# Patient Record
Sex: Male | Born: 1950 | Race: White | Hispanic: No | Marital: Single | Smoking: Never smoker
Health system: Southern US, Community
[De-identification: ages and names within clinical notes are randomized; demographics above are authoritative.]

## PROBLEM LIST (undated history)

## (undated) DIAGNOSIS — Z973 Presence of spectacles and contact lenses: Secondary | ICD-10-CM

## (undated) DIAGNOSIS — Z87442 Personal history of urinary calculi: Secondary | ICD-10-CM

## (undated) DIAGNOSIS — Z87438 Personal history of other diseases of male genital organs: Secondary | ICD-10-CM

## (undated) DIAGNOSIS — K649 Unspecified hemorrhoids: Secondary | ICD-10-CM

## (undated) DIAGNOSIS — M4622 Osteomyelitis of vertebra, cervical region: Secondary | ICD-10-CM

## (undated) DIAGNOSIS — E46 Unspecified protein-calorie malnutrition: Secondary | ICD-10-CM

## (undated) DIAGNOSIS — M722 Plantar fascial fibromatosis: Secondary | ICD-10-CM

## (undated) DIAGNOSIS — D649 Anemia, unspecified: Secondary | ICD-10-CM

## (undated) HISTORY — PX: COLONOSCOPY: SHX174

## (undated) HISTORY — PX: TONSILLECTOMY: SUR1361

## (undated) HISTORY — PX: KIDNEY STONE SURGERY: SHX686

## (undated) HISTORY — PX: HYDROCELE EXCISION: SHX482

---

## 2009-02-09 ENCOUNTER — Ambulatory Visit (HOSPITAL_BASED_OUTPATIENT_CLINIC_OR_DEPARTMENT_OTHER): Admission: RE | Admit: 2009-02-09 | Discharge: 2009-02-09 | Payer: Self-pay | Admitting: Urology

## 2010-09-17 LAB — POCT HEMOGLOBIN-HEMACUE: Hemoglobin: 14.8 g/dL (ref 13.0–17.0)

## 2010-10-25 NOTE — Op Note (Signed)
NAME:  Jacob Logan, Jacob Logan NO.:  [id number]   MEDICAL RECORD NO.:  [id number]          PATIENT TYPE:  AMB   LOCATION:  [facility]                         FACILITY:  [facility]   PHYSICIAN:  Excell Seltzer. Annabell Howells, M.D.    DATE OF BIRTH:  28-Jun-1950   DATE OF PROCEDURE:  02/09/2009  DATE OF DISCHARGE:                               OPERATIVE REPORT   PROCEDURES:  Cystoscopy, left retrograde pyelogram with interpretation,  left ureteroscopic stone extraction and insertion of left double-J  stent.   PREOPERATIVE DIAGNOSIS:  Left distal stones.   POSTOPERATIVE DIAGNOSIS:  Left distal stones.   SURGEON:  Dr. Bjorn Pippin.   ANESTHESIA:  General.   SPECIMEN:  Stones.   DRAIN:  6-French 24 cm double-J stent.   COMPLICATIONS:  None.   INDICATIONS:  Jacob Logan is a 60 year old white male with a 53-month  history of gross hematuria.  He has had some left-sided pain.  He was  found on CT to have two 5 mm distal left ureteral stones.  It was felt  that ureteroscopy was indicated.   FINDINGS AND PROCEDURE:  The patient was taken to the operating room  after receiving Cipro.  A general anesthetic was induced.  He was placed  in lithotomy position.  His perineum and genitalia were prepped with  Betadine solution.  He was draped in the usual sterile fashion.  Cystoscopy was performed using a 22-French scope and 12 and 70 degree  lenses.  Examination revealed a normal urethra.  The external sphincter  was intact.  The prostatic urethra was short with bilobar hyperplasia  with minimal obstruction.  Examination of the bladder revealed a smooth  bladder wall without tumor, stones or inflammation.  The ureteral  orifices were unremarkable.   The left ureteral orifice was cannulated with a 5-French open-end  catheter.  Contrast was instilled.  This revealed some narrowing of the  distal ureter with two stones just above the intramural ureter that were  mobile within the distal ureter.  There was  minimal proximal  hydronephrosis.  No other filling defects were identified.   A guidewire was then passed to the kidney through the open-end catheter  and the open-end catheter and cystoscope were removed.  A 4 cm 15-French  balloon dilation catheter was passed across the intramural ureter and  dilated to 18 atmospheres with resolution of the waste.  The balloon was  then deflated and removed.   A 6-French short ureteroscope was passed alongside the wire.  The stones  were visualized, grasped with a nitinol basket and removed sequentially  without difficulty.   The cystoscope was then reinserted over the wire and a 6-French 24 cm  double-J stent with string was then passed over the wire under  fluoroscopic guidance to the kidney.  The wire was removed leaving a  coil in the kidney and a coil in the bladder.  The bladder was drained.  B and O suppository was placed.  He was given 30 mg of Toradol.  He was  taken down from lithotomy position.  His anesthetic was  reversed.  He  was moved to the recovery room in stable condition.  There were no  complications.  He will be discharged home with prescriptions for  Percocet and Pyridium with instructions to follow-up with me on  February 17, 2009, at 11:30.  He will bring his stones when he returns  and will be encouraged to remove his stent at home on Friday morning by  pulling the attached string.      Excell Seltzer. Annabell Howells, M.D.  Electronically Signed     JJW/MEDQ  D:  02/09/2009  T:  02/09/2009  Job:  [id number]   cc:   Peyton Najjar, MD

## 2012-09-06 ENCOUNTER — Ambulatory Visit: Payer: Self-pay

## 2012-09-06 ENCOUNTER — Ambulatory Visit (INDEPENDENT_AMBULATORY_CARE_PROVIDER_SITE_OTHER): Payer: Self-pay | Admitting: Family Medicine

## 2012-09-06 VITALS — BP 113/70 | HR 57 | Temp 98.1°F | Resp 16 | Ht 68.0 in | Wt 138.0 lb

## 2012-09-06 DIAGNOSIS — M25529 Pain in unspecified elbow: Secondary | ICD-10-CM

## 2012-09-06 DIAGNOSIS — G8929 Other chronic pain: Secondary | ICD-10-CM

## 2012-09-06 DIAGNOSIS — M25521 Pain in right elbow: Secondary | ICD-10-CM

## 2012-09-06 MED ORDER — PREDNISONE 20 MG PO TABS: ORAL_TABLET | ORAL | Status: DC

## 2012-09-06 NOTE — Progress Notes (Signed)
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  No meds tried. The swelling is worsening.   No ecchymosis. Tried a band over the area to protect it against bumping it, but he didn't tolerate the pressure of the band.  Objective:  NAD Tender subq swelling which is firm over lateral epicondyle.  [facility] reading (PRIMARY) by  Dr.Cailean Heacock:  Right  Elbow.  No bony abn.  Some STS  Assessment:  Lateral epicondylitis  Plan  Elbow pain, chronic, right - Plan: DG Elbow Complete Right, predniSONE (DELTASONE) 20 MG tablet

## 2012-09-06 NOTE — Patient Instructions (Addendum)
Tennis Elbow  Your caregiver has diagnosed you with a condition often referred to as "tennis elbow." This results from small tears or soreness (inflammation) at the start (origin) of the extensor muscles of the forearm. Although the condition is often called tennis or golfer's elbow, it is caused by any repetitive action performed by your elbow.  HOME CARE INSTRUCTIONS   If the condition has been short lived, rest may be the only treatment required. Using your opposite hand or arm to perform the task may help. Even changing your grip may help rest the extremity. These may even prevent the condition from recurring.   Longer standing problems, however, will often be relieved faster by:   Using anti-inflammatory agents.   Applying ice packs for 30 minutes at the end of the working day, at bed time, or when activities are finished.   Your caregiver may also have you wear a splint or sling. This will allow the inflamed tendon to heal.  At times, steroid injections aided with a local anesthetic will be required along with splinting for 1 to 2 weeks. Two to three steroid injections will often solve the problem. In some long standing cases, the inflamed tendon does not respond to conservative (non-surgical) therapy. Then surgery may be required to repair it.  MAKE SURE YOU:    Understand these instructions.   Will watch your condition.   Will get help right away if you are not doing well or get worse.  Document Released: 05/29/2005 Document Revised: 08/21/2011 Document Reviewed: 01/15/2008  [employer] Patient Information 2013 [employer].

## 2013-02-13 ENCOUNTER — Encounter (HOSPITAL_COMMUNITY): Payer: Self-pay

## 2013-02-13 ENCOUNTER — Emergency Department (HOSPITAL_COMMUNITY)
Admission: EM | Admit: 2013-02-13 | Discharge: 2013-02-13 | Disposition: A | Discharge status: Home / Self Care | Payer: Worker's Compensation | Attending: Emergency Medicine | Admitting: Emergency Medicine

## 2013-02-13 ENCOUNTER — Emergency Department (HOSPITAL_COMMUNITY): Payer: Worker's Compensation

## 2013-02-13 DIAGNOSIS — W208XXA Other cause of strike by thrown, projected or falling object, initial encounter: Secondary | ICD-10-CM | POA: Insufficient documentation

## 2013-02-13 DIAGNOSIS — Y9289 Other specified places as the place of occurrence of the external cause: Secondary | ICD-10-CM | POA: Insufficient documentation

## 2013-02-13 DIAGNOSIS — S8990XA Unspecified injury of unspecified lower leg, initial encounter: Secondary | ICD-10-CM | POA: Insufficient documentation

## 2013-02-13 DIAGNOSIS — M79605 Pain in left leg: Secondary | ICD-10-CM

## 2013-02-13 DIAGNOSIS — Y990 Civilian activity done for income or pay: Secondary | ICD-10-CM | POA: Insufficient documentation

## 2013-02-13 DIAGNOSIS — Z87442 Personal history of urinary calculi: Secondary | ICD-10-CM | POA: Insufficient documentation

## 2013-02-13 DIAGNOSIS — Y939 Activity, unspecified: Secondary | ICD-10-CM | POA: Insufficient documentation

## 2013-02-13 DIAGNOSIS — IMO0002 Reserved for concepts with insufficient information to code with codable children: Secondary | ICD-10-CM | POA: Insufficient documentation

## 2013-02-13 MED ORDER — CYCLOBENZAPRINE HCL 10 MG PO TABS: 10.0000 mg | ORAL_TABLET | Freq: Two times a day (BID) | ORAL | Status: DC | PRN

## 2013-02-13 MED ORDER — ACETAMINOPHEN 325 MG PO TABS
650.0000 mg | ORAL_TABLET | Freq: Once | ORAL | Status: AC
  Administered 2013-02-13: 650 mg via ORAL
  Filled 2013-02-13: qty 2

## 2013-02-13 NOTE — ED Provider Notes (Signed)
CSN: [id number]     Arrival date & time 02/13/13  0739 History   First MD Initiated Contact with Patient 02/13/13 0747     No chief complaint on file.  (Consider location/radiation/quality/duration/timing/severity/associated sxs/prior Treatment) HPI  Jacob Logan is a 62 y.o. male complaining of left lower extremity and right foot pain after patient was injured at work at 11 PM last night. Patient pieces of wood fall from a position where they were propped against the wall onto him and pinned him. Patient denies any head trauma, LOC, headache, chest pain, shortness of breath, abdominal pain. Patient rates his pain as moderate, 7/10, exacerbated by weightbearing and palpation.  Past Medical History  Diagnosis Date  . Kidney stone    Past Surgical History  Procedure Laterality Date  . Tonsillectomy    . Kidney stone surgery     Family History  Problem Relation Age of Onset  . Hypertension Mother   . Uterine cancer Mother   . Obesity Mother   . Obesity Father   . Hypertension Father   . Diabetes Father   . Stroke Brother   . Obesity Brother   . Alzheimer's disease Brother   . Cataracts Maternal Grandmother    History  Substance Use Topics  . Smoking status: Never Smoker   . Smokeless tobacco: Not on file  . Alcohol Use: 0.5 oz/week    1 drink(s) per week    Review of Systems 10 systems reviewed and found to be negative, except as noted in the HPI  Allergies  Review of patient's allergies indicates no known allergies.  Home Medications   Current Outpatient Rx  Name  Route  Sig  Dispense  Refill  . predniSONE (DELTASONE) 20 MG tablet      Two daily with food   10 tablet   1    BP 113/55  Pulse 61  Temp(Src) 98.1 F (36.7 C) (Oral)  Resp 18  SpO2 99% Physical Exam  Nursing note and vitals reviewed. Constitutional: He is oriented to person, place, and time. He appears well-developed and well-nourished. No distress.  HENT:  Head: Normocephalic.  Eyes:  Conjunctivae and EOM are normal.  Cardiovascular: Normal rate.   Pulmonary/Chest: Effort normal. No stridor.  Musculoskeletal: Normal range of motion.  No deformities, dorsalis pedis is 2+ bilaterally. Patient is very mildly tender to palpation in the left lateral calf, there is no ecchymoses.  Bilateral knees show No deformity, erythema or abrasions. FROM. No effusion or crepitance. Anterior and posterior drawer show no abnormal laxity. Stable to valgus and varus stress. Joint lines are non-tender. Neurovascularly intact.   Patient has mild erythema to left elbow, full range of motion, no tenderness to palpation. Distally neurovascularly intact.  Neurological: He is alert and oriented to person, place, and time.  Psychiatric: He has a normal mood and affect.    ED Course  Procedures (including critical care time) Labs Review Labs Reviewed - No data to display Imaging Review Dg Ankle Complete Left  02/13/2013   CLINICAL DATA:  Left ankle pain.  EXAM: LEFT ANKLE COMPLETE - 3+ VIEW  COMPARISON:  None.  FINDINGS: There is no evidence of fracture, dislocation, or joint effusion. There is no evidence of arthropathy or other focal bone abnormality. Soft tissues are unremarkable.  IMPRESSION: Negative.   Electronically Signed   By: Myles Rosenthal   On: 02/13/2013 09:03   Dg Knee Complete 4 Views Left  02/13/2013   CLINICAL DATA:  Left knee pain.  EXAM: LEFT KNEE - COMPLETE 4+ VIEW  COMPARISON:  None.  FINDINGS: There is no evidence of fracture, dislocation, or joint effusion. There is no evidence of arthropathy or other focal bone abnormality. Soft tissues are unremarkable.  IMPRESSION: Negative.   Electronically Signed   By: Myles Rosenthal   On: 02/13/2013 09:07   Dg Foot Complete Right  02/13/2013   CLINICAL DATA:  Right foot pain.  EXAM: RIGHT FOOT COMPLETE - 3+ VIEW  COMPARISON:  None.  FINDINGS: A nondisplaced distal 5th metatarsal fracture is seen which shows some sclerosis consistent with partial  healing. This is consistent with a subacute fracture. No other fractures are identified. No evidence of dislocation.  IMPRESSION: Healing subacute distal 5th metatarsal fracture.   Electronically Signed   By: Myles Rosenthal   On: 02/13/2013 09:06    MDM   1. Musculoskeletal pain of left lower extremity    Filed Vitals:   02/13/13 0750  BP: 113/55  Pulse: 61  Temp: 98.1 F (36.7 C)  TempSrc: Oral  Resp: 18  SpO2: 99%     Jacob Logan is a 62 y.o. male with left calf and right foot pain after patient was injured last night at work. Physical exam shows no abnormalities other than erythema to the right elbow and tenderness to the left lateral calf.  Medications  acetaminophen (TYLENOL) tablet 650 mg (650 mg Oral Given 02/13/13 0820)   Pt is hemodynamically stable, appropriate for, and amenable to discharge at this time. Pt verbalized understanding and agrees with care plan. All questions answered. Outpatient follow-up and specific return precautions discussed.    New Prescriptions   CYCLOBENZAPRINE (FLEXERIL) 10 MG TABLET    Take 1 tablet (10 mg total) by mouth 2 (two) times daily as needed for muscle spasms.    Note: Portions of this report may have been transcribed using voice recognition software. Every effort was made to ensure accuracy; however, inadvertent computerized transcription errors may be present      Wynetta Emery, PA-C 02/13/13 [phone]

## 2013-02-13 NOTE — ED Notes (Signed)
Pt. Was injured at work last night. Had 2 boards fall on him and pin him. Both legs were bent up.  Having pain in lt. Lower leg.  bruising to his lt. Elbow.

## 2013-02-13 NOTE — ED Provider Notes (Signed)
Medical screening examination/treatment/procedure(s) were performed by non-physician practitioner and as supervising physician I was immediately available for consultation/collaboration.   Glynn Octave, MD 02/13/13 [phone]

## 2013-02-14 ENCOUNTER — Ambulatory Visit (HOSPITAL_COMMUNITY)
Admission: RE | Admit: 2013-02-14 | Discharge: 2013-02-14 | Disposition: A | Discharge status: Home / Self Care | Payer: Worker's Compensation | Source: Ambulatory Visit | Attending: Surgery | Admitting: Surgery

## 2013-02-14 ENCOUNTER — Other Ambulatory Visit (HOSPITAL_COMMUNITY): Payer: Self-pay | Admitting: Surgery

## 2013-02-14 DIAGNOSIS — M25562 Pain in left knee: Secondary | ICD-10-CM

## 2013-02-14 DIAGNOSIS — M25569 Pain in unspecified knee: Secondary | ICD-10-CM | POA: Insufficient documentation

## 2013-02-14 DIAGNOSIS — M7989 Other specified soft tissue disorders: Secondary | ICD-10-CM

## 2013-02-14 NOTE — Progress Notes (Signed)
Left lower extremity venous duplex completed.  Left:  No evidence of DVT, superficial thrombosis, or Baker's cyst.  Right:  Negative for DVT in the common femoral vein.  

## 2013-05-18 ENCOUNTER — Ambulatory Visit: Payer: Commercial Managed Care - PPO

## 2013-05-18 ENCOUNTER — Ambulatory Visit (INDEPENDENT_AMBULATORY_CARE_PROVIDER_SITE_OTHER): Payer: Commercial Managed Care - PPO | Admitting: Emergency Medicine

## 2013-05-18 VITALS — BP 118/70 | HR 85 | Temp 99.3°F | Resp 16 | Ht 68.5 in | Wt 141.8 lb

## 2013-05-18 DIAGNOSIS — R059 Cough, unspecified: Secondary | ICD-10-CM

## 2013-05-18 DIAGNOSIS — M549 Dorsalgia, unspecified: Secondary | ICD-10-CM

## 2013-05-18 DIAGNOSIS — N200 Calculus of kidney: Secondary | ICD-10-CM

## 2013-05-18 DIAGNOSIS — N10 Acute tubulo-interstitial nephritis: Secondary | ICD-10-CM

## 2013-05-18 DIAGNOSIS — R05 Cough: Secondary | ICD-10-CM

## 2013-05-18 DIAGNOSIS — R300 Dysuria: Secondary | ICD-10-CM

## 2013-05-18 LAB — POCT URINALYSIS DIPSTICK
Glucose, UA: NEGATIVE
Nitrite, UA: POSITIVE

## 2013-05-18 LAB — POCT UA - MICROSCOPIC ONLY
Mucus, UA: POSITIVE
Yeast, UA: NEGATIVE

## 2013-05-18 LAB — POCT CBC
HCT, POC: 42.9 % — AB (ref 43.5–53.7)
Hemoglobin: 13.2 g/dL — AB (ref 14.1–18.1)
Lymph, poc: 1 (ref 0.6–3.4)
MCH, POC: 28.8 pg (ref 27–31.2)
MCHC: 30.8 g/dL — AB (ref 31.8–35.4)
MCV: 93.5 fL (ref 80–97)
POC Granulocyte: 10.2 — AB (ref 2–6.9)
POC LYMPH PERCENT: 8.6 %L — AB (ref 10–50)
RDW, POC: 13.1 %
WBC: 11.8 10*3/uL — AB (ref 4.6–10.2)

## 2013-05-18 LAB — IFOBT (OCCULT BLOOD): IFOBT: NEGATIVE

## 2013-05-18 MED ORDER — CEFTRIAXONE SODIUM 1 G IJ SOLR
1.0000 g | INTRAMUSCULAR | Status: DC
  Administered 2013-05-18: 1 g via INTRAMUSCULAR

## 2013-05-18 MED ORDER — LEVOFLOXACIN 500 MG PO TABS: 500.0000 mg | ORAL_TABLET | Freq: Every day | ORAL | Status: AC

## 2013-05-18 MED ORDER — OMEPRAZOLE 20 MG PO CPDR: 20.0000 mg | DELAYED_RELEASE_CAPSULE | Freq: Every day | ORAL | Status: DC

## 2013-05-18 MED ORDER — BENZONATATE 100 MG PO CAPS: 100.0000 mg | ORAL_CAPSULE | Freq: Three times a day (TID) | ORAL | Status: DC | PRN

## 2013-05-18 NOTE — Progress Notes (Addendum)
Subjective:    Patient ID: Jacob Logan, male    DOB: Oct 23, 1950, 62 y.o.   MRN: [id number] This chart was scribed for Lesle Chris, MD by Danella Maiers, ED Scribe. This patient was seen in room 10 and the patient's care was started at 4:16 PM.  Chief Complaint  Patient presents with  . Cough    x6 weeks  . Back Pain    left lower x1 week  . Pelvic Pain    left, started today  . Dysuria    "hematuria/burning" started today    HPI HPI Comments: Jacob Logan is a 62 y.o. male with a h/o kidney stones who presents to the [facility] complaining of lower back pain onset five days ago and hematuria with "little flakes of something". He has had surgical removal of stones in the past. He states he has been eating well until this week due to nausea. He denies vomiting. He is a non-smoker.  He also reports persistent cough with green phlegm for the past 6 weeks.  There are no active problems to display for this patient.  Past Medical History  Diagnosis Date  . Kidney stone    Past Surgical History  Procedure Laterality Date  . Tonsillectomy    . Kidney stone surgery     No Known Allergies Prior to Admission medications   Medication Sig Start Date End Date Taking? Authorizing Provider  bisacodyl (DULCOLAX) 5 MG EC tablet Take 5 mg by mouth daily as needed for moderate constipation.   Yes Historical Provider, MD  cyclobenzaprine (FLEXERIL) 10 MG tablet Take 1 tablet (10 mg total) by mouth 2 (two) times daily as needed for muscle spasms. 02/13/13   Joni Reining Pisciotta, PA-C   History  Substance Use Topics  . Smoking status: Never Smoker   . Smokeless tobacco: Not on file  . Alcohol Use: 0.5 oz/week    1 drink(s) per week     Review of Systems  Respiratory: Positive for cough.   Gastrointestinal: Positive for nausea. Negative for vomiting.  Genitourinary: Positive for hematuria.  Musculoskeletal: Positive for back pain.       Objective:   Physical  Exam CONSTITUTIONAL: Well developed/well nourished HEAD: Normocephalic/atraumatic EYES: EOMI/PERRL ENMT: Mucous membranes moist NECK: supple no meningeal signs SPINE: Patient has a significant kyphoscoliotic curve. CV: S1/S2 noted, no murmurs/rubs/gallops noted LUNGS: Lungs are clear to auscultation bilaterally, no apparent distress ABDOMEN: soft, nontender, no rebound or guarding there is mild tenderness over the prostate gland but not in the left lower quadrant the GU there is left  CVA tenderness NEURO: Pt is awake/alert, moves all extremitiesx4 EXTREMITIES: pulses normal, full ROM SKIN: warm, color normal PSYCH: no abnormalities of mood noted Prostate is normal size without nodularity or tenderness  Filed Vitals:   05/18/13 1539  BP: 118/70  Pulse: 85  Temp: 99.3 F (37.4 C)  TempSrc: Oral  Resp: 16  Height: 5' 8.5" (1.74 m)  Weight: 141 lb 12.8 oz (64.32 kg)  SpO2: 100%  [facility] reading (PRIMARY) by  Dr. Cleta Alberts patient has a severe kyphoscoliosis. Lung fields appear clear. There are 2 calcific densities in the lower portion of the right kidney. These would be consistent with stones. There are also multiple calcific densities present in the prostate gland. Results for orders placed in visit on 05/18/13  POCT UA - MICROSCOPIC ONLY      Result Value Range   WBC, Ur, HPF, POC TNTC     RBC,  urine, microscopic TNTC     Bacteria, U Microscopic 3+     Mucus, UA Pos     Epithelial cells, urine per micros neg     Crystals, Ur, HPF, POC neg     Casts, Ur, LPF, POC neg     Yeast, UA neg    POCT URINALYSIS DIPSTICK      Result Value Range   Color, UA Brown     Clarity, UA Turbid     Glucose, UA Neg     Bilirubin, UA small     Ketones, UA 40     Spec Grav, UA >=1.030     Blood, UA Large     pH, UA 5.5     Protein, UA >300     Urobilinogen, UA 1.0     Nitrite, UA pos     Leukocytes, UA large (3+)    POCT CBC      Result Value Range   WBC 11.8 (*) 4.6 - 10.2 K/uL   Lymph, poc  1.0  0.6 - 3.4   POC LYMPH PERCENT 8.6 (*) 10 - 50 %L   MID (cbc) 0.6  0 - 0.9   POC MID % 4.9  0 - 12 %M   POC Granulocyte 10.2 (*) 2 - 6.9   Granulocyte percent 86.5 (*) 37 - 80 %G   RBC 4.59 (*) 4.69 - 6.13 M/uL   Hemoglobin 13.2 (*) 14.1 - 18.1 g/dL   HCT, POC 08.6 (*) 57.8 - 53.7 %   MCV 93.5  80 - 97 fL   MCH, POC 28.8  27 - 31.2 pg   MCHC 30.8 (*) 31.8 - 35.4 g/dL   RDW, POC 46.9     Platelet Count, POC 176  142 - 424 K/uL   MPV 9.6  0 - 99.8 fL  IFOBT (OCCULT BLOOD)      Result Value Range   IFOBT Negative          Assessment & Plan:  Patient appears to have infection either in the prostate or round the right renal calculi. Urgent referral will be made to Dr. Merry Lofty. You will be placed on Levaquin 500 mg daily urine culture was done referral made back to Dr. Merry Lofty. He will be given 1 g of Rocephin here.Marland Kitchen He is tender in the left flank area but his kidney stone is visible on the right side.  I personally performed the services described in this documentation, which was scribed in my presence. The recorded information has been reviewed and is accurate.

## 2013-05-18 NOTE — Patient Instructions (Signed)
Cough, Adult  A cough is a reflex that helps clear your throat and airways. It can help heal the body or may be a reaction to an irritated airway. A cough may only last 2 or 3 weeks (acute) or may last more than 8 weeks (chronic).  CAUSES Acute cough:  Viral or bacterial infections. Chronic cough:  Infections.  Allergies.  Asthma.  Post-nasal drip.  Smoking.  Heartburn or acid reflux.  Some medicines.  Chronic lung problems (COPD).  Cancer. SYMPTOMS   Cough.  Fever.  Chest pain.  Increased breathing rate.  High-pitched whistling sound when breathing (wheezing).  Colored mucus that you cough up (sputum). TREATMENT   A bacterial cough may be treated with antibiotic medicine.  A viral cough must run its course and will not respond to antibiotics.  Your caregiver may recommend other treatments if you have a chronic cough. HOME CARE INSTRUCTIONS   Only take over-the-counter or prescription medicines for pain, discomfort, or fever as directed by your caregiver. Use cough suppressants only as directed by your caregiver.  Use a cold steam vaporizer or humidifier in your bedroom or home to help loosen secretions.  Sleep in a semi-upright position if your cough is worse at night.  Rest as needed.  Stop smoking if you smoke. SEEK IMMEDIATE MEDICAL CARE IF:   You have pus in your sputum.  Your cough starts to worsen.  You cannot control your cough with suppressants and are losing sleep.  You begin coughing up blood.  You have difficulty breathing.  You develop pain which is getting worse or is uncontrolled with medicine.  You have a fever. MAKE SURE YOU:   Understand these instructions.  Will watch your condition.  Will get help right away if you are not doing well or get worse. Document Released: 11/25/2010 Document Revised: 08/21/2011 Document Reviewed: 11/25/2010 [facility] Patient Information 2014 [place], [place]. Kidney Stones Kidney stones  (urolithiasis) are deposits that form inside your kidneys. The intense pain is caused by the stone moving through the urinary tract. When the stone moves, the ureter goes into spasm around the stone. The stone is usually passed in the urine.  CAUSES   A disorder that makes certain neck glands produce too much parathyroid hormone (primary hyperparathyroidism).  A buildup of uric acid crystals, similar to gout in your joints.  Narrowing (stricture) of the ureter.  A kidney obstruction present at birth (congenital obstruction).  Previous surgery on the kidney or ureters.  Numerous kidney infections. SYMPTOMS   Feeling sick to your stomach (nauseous).  Throwing up (vomiting).  Blood in the urine (hematuria).  Pain that usually spreads (radiates) to the groin.  Frequency or urgency of urination. DIAGNOSIS   Taking a history and physical exam.  Blood or urine tests.  CT scan.  Occasionally, an examination of the inside of the urinary bladder (cystoscopy) is performed. TREATMENT   Observation.  Increasing your fluid intake.  Extracorporeal shock wave lithotripsy This is a noninvasive procedure that uses shock waves to break up kidney stones.  Surgery may be needed if you have severe pain or persistent obstruction. There are various surgical procedures. Most of the procedures are performed with the use of small instruments. Only small incisions are needed to accommodate these instruments, so recovery time is minimized. The size, location, and chemical composition are all important variables that will determine the proper choice of action for you. Talk to your health care provider to better understand your situation so that you  will minimize the risk of injury to yourself and your kidney.  HOME CARE INSTRUCTIONS   Drink enough water and fluids to keep your urine clear or pale yellow. This will help you to pass the stone or stone fragments.  Strain all urine through the provided  strainer. Keep all particulate matter and stones for your health care provider to see. The stone causing the pain may be as small as a grain of salt. It is very important to use the strainer each and every time you pass your urine. The collection of your stone will allow your health care provider to analyze it and verify that a stone has actually passed. The stone analysis will often identify what you can do to reduce the incidence of recurrences.  Only take over-the-counter or prescription medicines for pain, discomfort, or fever as directed by your health care provider.  Make a follow-up appointment with your health care provider as directed.  Get follow-up X-rays if required. The absence of pain does not always mean that the stone has passed. It may have only stopped moving. If the urine remains completely obstructed, it can cause loss of kidney function or even complete destruction of the kidney. It is your responsibility to make sure X-rays and follow-ups are completed. Ultrasounds of the kidney can show blockages and the status of the kidney. Ultrasounds are not associated with any radiation and can be performed easily in a matter of minutes. SEEK MEDICAL CARE IF:  You experience pain that is progressive and unresponsive to any pain medicine you have been prescribed. SEEK IMMEDIATE MEDICAL CARE IF:   Pain cannot be controlled with the prescribed medicine.  You have a fever or shaking chills.  The severity or intensity of pain increases over 18 hours and is not relieved by pain medicine.  You develop a new onset of abdominal pain.  You feel faint or pass out.  You are unable to urinate. MAKE SURE YOU:   Understand these instructions.  Will watch your condition.  Will get help right away if you are not doing well or get worse. Document Released: 05/29/2005 Document Revised: 01/29/2013 Document Reviewed: 10/30/2012 [employer] Patient Information 2014 [place], [place]. Pyelonephritis,  Adult Pyelonephritis is a kidney infection. In general, there are 2 main types of pyelonephritis:  Infections that come on quickly without any warning (acute pyelonephritis).  Infections that persist for a long period of time (chronic pyelonephritis). CAUSES  Two main causes of pyelonephritis are:  Bacteria traveling from the bladder to the kidney. This is a problem especially in pregnant women. The urine in the bladder can become filled with bacteria from multiple causes, including:  Inflammation of the prostate gland (prostatitis).  Sexual intercourse in females.  Bladder infection (cystitis).  Bacteria traveling from the bloodstream to the tissue part of the kidney. Problems that may increase your risk of getting a kidney infection include:  Diabetes.  Kidney stones or bladder stones.  Cancer.  Catheters placed in the bladder.  Other abnormalities of the kidney or ureter. SYMPTOMS   Abdominal pain.  Pain in the side or flank area.  Fever.  Chills.  Upset stomach.  Blood in the urine (dark urine).  Frequent urination.  Strong or persistent urge to urinate.  Burning or stinging when urinating. DIAGNOSIS  Your caregiver may diagnose your kidney infection based on your symptoms. A urine sample may also be taken. TREATMENT  In general, treatment depends on how severe the infection is.   If the infection  is mild and caught early, your caregiver may treat you with oral antibiotics and send you home.  If the infection is more severe, the bacteria may have gotten into the bloodstream. This will require intravenous (IV) antibiotics and a hospital stay. Symptoms may include:  High fever.  Severe flank pain.  Shaking chills.  Even after a hospital stay, your caregiver may require you to be on oral antibiotics for a period of time.  Other treatments may be required depending upon the cause of the infection. HOME CARE INSTRUCTIONS   Take your antibiotics as  directed. Finish them even if you start to feel better.  Make an appointment to have your urine checked to make sure the infection is gone.  Drink enough fluids to keep your urine clear or pale yellow.  Take medicines for the bladder if you have urgency and frequency of urination as directed by your caregiver. SEEK IMMEDIATE MEDICAL CARE IF:   You have a fever or persistent symptoms for more than 2-3 days.  You have a fever and your symptoms suddenly get worse.  You are unable to take your antibiotics or fluids.  You develop shaking chills.  You experience extreme weakness or fainting.  There is no improvement after 2 days of treatment. MAKE SURE YOU:  Understand these instructions.  Will watch your condition.  Will get help right away if you are not doing well or get worse. Document Released: 05/29/2005 Document Revised: 11/28/2011 Document Reviewed: 11/02/2010 [facility] Patient Information 2014 [place], [place].

## 2013-05-19 LAB — COMPREHENSIVE METABOLIC PANEL
ALT: 13 U/L (ref 0–53)
AST: 20 U/L (ref 0–37)
Albumin: 4 g/dL (ref 3.5–5.2)
Alkaline Phosphatase: 79 U/L (ref 39–117)
Calcium: 9.2 mg/dL (ref 8.4–10.5)
Chloride: 98 mEq/L (ref 96–112)
Potassium: 4 mEq/L (ref 3.5–5.3)
Sodium: 134 mEq/L — ABNORMAL LOW (ref 135–145)

## 2013-05-20 LAB — URINE CULTURE

## 2014-03-07 ENCOUNTER — Ambulatory Visit (INDEPENDENT_AMBULATORY_CARE_PROVIDER_SITE_OTHER): Payer: Commercial Managed Care - PPO | Admitting: Internal Medicine

## 2014-03-07 VITALS — BP 108/70 | HR 51 | Resp 21 | Ht 67.5 in | Wt 135.2 lb

## 2014-03-07 DIAGNOSIS — N200 Calculus of kidney: Secondary | ICD-10-CM

## 2014-03-07 DIAGNOSIS — R350 Frequency of micturition: Secondary | ICD-10-CM

## 2014-03-07 DIAGNOSIS — R82998 Other abnormal findings in urine: Secondary | ICD-10-CM

## 2014-03-07 DIAGNOSIS — R8281 Pyuria: Secondary | ICD-10-CM

## 2014-03-07 DIAGNOSIS — R319 Hematuria, unspecified: Secondary | ICD-10-CM

## 2014-03-07 DIAGNOSIS — R42 Dizziness and giddiness: Secondary | ICD-10-CM

## 2014-03-07 DIAGNOSIS — N390 Urinary tract infection, site not specified: Secondary | ICD-10-CM

## 2014-03-07 DIAGNOSIS — R1032 Left lower quadrant pain: Secondary | ICD-10-CM

## 2014-03-07 LAB — POCT UA - MICROSCOPIC ONLY
Casts, Ur, LPF, POC: NEGATIVE
Crystals, Ur, HPF, POC: NEGATIVE
MUCUS UA: NEGATIVE
Yeast, UA: NEGATIVE

## 2014-03-07 LAB — POCT CBC
Granulocyte percent: 87.4 %G — AB (ref 37–80)
HCT, POC: 41.4 % — AB (ref 43.5–53.7)
HEMOGLOBIN: 13.5 g/dL — AB (ref 14.1–18.1)
LYMPH, POC: 0.6 (ref 0.6–3.4)
MCH: 29.4 pg (ref 27–31.2)
MCHC: 32.5 g/dL (ref 31.8–35.4)
MCV: 90.4 fL (ref 80–97)
MID (CBC): 0.4 (ref 0–0.9)
MPV: 7.5 fL (ref 0–99.8)
POC Granulocyte: 6.8 (ref 2–6.9)
POC LYMPH PERCENT: 8 %L — AB (ref 10–50)
POC MID %: 4.6 % (ref 0–12)
Platelet Count, POC: 171 10*3/uL (ref 142–424)
RBC: 4.58 M/uL — AB (ref 4.69–6.13)
RDW, POC: 14.8 %
WBC: 7.8 10*3/uL (ref 4.6–10.2)

## 2014-03-07 LAB — POCT URINALYSIS DIPSTICK
BILIRUBIN UA: NEGATIVE
Glucose, UA: NEGATIVE
KETONES UA: 40
Nitrite, UA: POSITIVE
PH UA: 7
Protein, UA: 30
SPEC GRAV UA: 1.02
Urobilinogen, UA: 0.2

## 2014-03-07 MED ORDER — ONDANSETRON HCL 4 MG PO TABS: 4.0000 mg | ORAL_TABLET | Freq: Three times a day (TID) | ORAL | Status: DC | PRN

## 2014-03-07 MED ORDER — KETOROLAC TROMETHAMINE 60 MG/2ML IM SOLN
60.0000 mg | Freq: Once | INTRAMUSCULAR | Status: AC
  Administered 2014-03-07: 60 mg via INTRAMUSCULAR

## 2014-03-07 MED ORDER — CEFTRIAXONE SODIUM 1 G IJ SOLR
1.0000 g | Freq: Once | INTRAMUSCULAR | Status: AC
  Administered 2014-03-07: 1 g via INTRAMUSCULAR

## 2014-03-07 MED ORDER — HYDROCODONE-ACETAMINOPHEN 5-325 MG PO TABS: 1.0000 | ORAL_TABLET | Freq: Four times a day (QID) | ORAL | Status: DC | PRN

## 2014-03-07 MED ORDER — CIPROFLOXACIN HCL 500 MG PO TABS
500.0000 mg | ORAL_TABLET | Freq: Two times a day (BID) | ORAL | Status: DC
Start: 2014-03-07 — End: 2014-10-18

## 2014-03-07 NOTE — Progress Notes (Signed)
Subjective:  This chart was scribed for Tami Lin, MD by Molli Posey, Medical scribe. This patient was seen in ROOM 8 and the patient's care was started 3:41 PM.   Patient ID: Jacob Logan, male    DOB: 12/14/1950, 63 y.o.   MRN: [id number]  Abdominal Pain Associated symptoms include frequency and nausea. Pertinent negatives include no diarrhea, dysuria, fever or hematuria.  Back Pain Associated symptoms include abdominal pain. Pertinent negatives include no dysuria or fever.   HPI Comments: Jacob Logan is a 63 y.o. male who presents to [facility] complaining of abdominal pressure that is consistent with a possible kidney stone that started this morning. Suprapub discmt w/ incr freq. He has a history of kidney stones but no history of diverticulitis. His last colonoscopy was 10 years ago.  He is not taking any current medications and does not have any allergies to medications. He reports associated frequency, left flank pain, hesitancy, dizziness, fatigue, nausea and diaphoresis. He reports hematochezia but contributes this to hemorrhoids. He denies fever, hematuria, and dysuria.    There are no active problems to display for this patient.  Past Medical History  Diagnosis Date  . Kidney stone    Past Surgical History  Procedure Laterality Date  . Tonsillectomy    . Kidney stone surgery     Prior to Admission medications   Medication Sig Start Date End Date Taking? Authorizing Provider  benzonatate (TESSALON) 100 MG capsule Take 1-2 capsules (100-200 mg total) by mouth 3 (three) times daily as needed for cough. 05/18/13   Darlyne Russian, MD  bisacodyl (DULCOLAX) 5 MG EC tablet Take 5 mg by mouth daily as needed for moderate constipation.    Historical Provider, MD  cyclobenzaprine (FLEXERIL) 10 MG tablet Take 1 tablet (10 mg total) by mouth 2 (two) times daily as needed for muscle spasms. 02/13/13   Nicole Pisciotta, PA-C  omeprazole (PRILOSEC) 20 MG capsule Take 1 capsule (20 mg  total) by mouth daily. 05/18/13   Darlyne Russian, MD   See hx 12/14 presentation  No Known Allergies   Review of Systems  Constitutional: Positive for diaphoresis and fatigue. Negative for fever.  Gastrointestinal: Positive for nausea, abdominal pain and blood in stool. Negative for diarrhea.  Genitourinary: Positive for urgency, frequency and flank pain. Negative for dysuria and hematuria.  Musculoskeletal: Positive for back pain.  Neurological: Positive for dizziness.       Objective:   Physical Exam  Nursing note and vitals reviewed. Constitutional: He is oriented to person, place, and time. He appears well-developed and well-nourished.  He looks uncomfortable  HENT:  Head: Normocephalic and atraumatic.  Eyes: Conjunctivae and EOM are normal. Pupils are equal, round, and reactive to light.  Neck: Neck supple.  Cardiovascular: Normal rate.   Pulmonary/Chest: Effort normal.  Abdominal: Bowel sounds are normal. He exhibits no distension.  Tender to palpation in the LLQ and suprapubic area but no rebound or masses. Percussion in the left flank creates radicular pain in the LLQ.   Neurological: He is alert and oriented to person, place, and time. No cranial nerve deficit.  Psychiatric: He has a normal mood and affect. His behavior is normal.    toradol  60 improv pain 50% BP 108/70  Pulse 51  Resp 21  Ht 5' 7.5" (1.715 m)  Wt 135 lb 3.2 oz (61.326 kg)  BMI 20.85 kg/m2  SpO2 100%  Results for orders placed in visit on 03/07/14  POCT CBC  Result Value Ref Range   WBC 7.8  4.6 - 10.2 K/uL   Lymph, poc 0.6  0.6 - 3.4   POC LYMPH PERCENT 8.0 (*) 10 - 50 %L   MID (cbc) 0.4  0 - 0.9   POC MID % 4.6  0 - 12 %M   POC Granulocyte 6.8  2 - 6.9   Granulocyte percent 87.4 (*) 37 - 80 %G   RBC 4.58 (*) 4.69 - 6.13 M/uL   Hemoglobin 13.5 (*) 14.1 - 18.1 g/dL   HCT, POC 41.4 (*) 43.5 - 53.7 %   MCV 90.4  80 - 97 fL   MCH, POC 29.4  27 - 31.2 pg   MCHC 32.5  31.8 - 35.4 g/dL     RDW, POC 14.8     Platelet Count, POC 171  142 - 424 K/uL   MPV 7.5  0 - 99.8 fL  POCT UA - MICROSCOPIC ONLY      Result Value Ref Range   WBC, Ur, HPF, POC TNTC     RBC, urine, microscopic TNTC     Bacteria, U Microscopic small     Mucus, UA neg     Epithelial cells, urine per micros 1-2     Crystals, Ur, HPF, POC neg     Casts, Ur, LPF, POC neg     Yeast, UA neg    POCT URINALYSIS DIPSTICK      Result Value Ref Range   Color, UA yellow     Clarity, UA cloudy     Glucose, UA neg     Bilirubin, UA neg     Ketones, UA 40     Spec Grav, UA 1.020     Blood, UA large     pH, UA 7.0     Protein, UA 30     Urobilinogen, UA 0.2     Nitrite, UA positive     Leukocytes, UA large (3+)     toradol 60im greatly helped     Assessment & Plan:  I personally performed the services described in this documentation, which was scribed in my presence. The recorded information has been reviewed and is accurate.  Urinary frequency  W/ Pain, abdominal, LLQ -  Dizzy-intermittent  Pyuria /Urinary tract infection likely   Ambulatory referral to Urology to r/o nidus of recurrent infecton  cefTRIAXone (ROCEPHIN) injection 1 g, Ambulatory referral to Urology  cipro 500 bid 10d  Hematuria - Nephrolithiasis likely - Plan: Ambulatory referral to Urology  UC to lab w/ close f/u.  Meds ordered this encounter  Medications  . ketorolac (TORADOL) injection 60 mg    Sig:   . cefTRIAXone (ROCEPHIN) injection 1 g    Sig:     Order Specific Question:  Antibiotic Indication:    Answer:  UTI  . ciprofloxacin (CIPRO) 500 MG tablet    Sig: Take 1 tablet (500 mg total) by mouth 2 (two) times daily.    Dispense:  20 tablet    Refill:  0  . HYDROcodone-acetaminophen (NORCO/VICODIN) 5-325 MG per tablet    Sig: Take 1 tablet by mouth every 6 (six) hours as needed for moderate pain.    Dispense:  20 tablet    Refill:  0  . ondansetron (ZOFRAN) 4 MG tablet    Sig: Take 1 tablet (4 mg total) by mouth  every 8 (eight) hours as needed for nausea or vomiting.    Dispense:  12 tablet    Refill:  0    

## 2014-03-08 LAB — COMPREHENSIVE METABOLIC PANEL
ALT: 18 U/L (ref 0–53)
AST: 23 U/L (ref 0–37)
Albumin: 4.3 g/dL (ref 3.5–5.2)
Alkaline Phosphatase: 69 U/L (ref 39–117)
BILIRUBIN TOTAL: 0.7 mg/dL (ref 0.2–1.2)
BUN: 17 mg/dL (ref 6–23)
CO2: 24 meq/L (ref 19–32)
CREATININE: 1.07 mg/dL (ref 0.50–1.35)
Calcium: 9.5 mg/dL (ref 8.4–10.5)
Chloride: 105 mEq/L (ref 96–112)
GLUCOSE: 131 mg/dL — AB (ref 70–99)
Potassium: 4.4 mEq/L (ref 3.5–5.3)
Sodium: 138 mEq/L (ref 135–145)
Total Protein: 6.7 g/dL (ref 6.0–8.3)

## 2014-03-09 LAB — URINE CULTURE
Colony Count: NO GROWTH
Organism ID, Bacteria: NO GROWTH

## 2014-10-18 ENCOUNTER — Ambulatory Visit (INDEPENDENT_AMBULATORY_CARE_PROVIDER_SITE_OTHER): Payer: Commercial Managed Care - PPO | Admitting: Internal Medicine

## 2014-10-18 VITALS — BP 108/72 | HR 70 | Temp 98.9°F | Resp 16 | Ht 70.0 in | Wt 138.0 lb

## 2014-10-18 DIAGNOSIS — R109 Unspecified abdominal pain: Secondary | ICD-10-CM | POA: Diagnosis not present

## 2014-10-18 DIAGNOSIS — M4120 Other idiopathic scoliosis, site unspecified: Secondary | ICD-10-CM | POA: Insufficient documentation

## 2014-10-18 LAB — POCT URINALYSIS DIPSTICK
Bilirubin, UA: NEGATIVE
Glucose, UA: NEGATIVE
Ketones, UA: 15
Leukocytes, UA: NEGATIVE
NITRITE UA: NEGATIVE
PH UA: 5.5
PROTEIN UA: 30
Spec Grav, UA: 1.01
UROBILINOGEN UA: 0.2

## 2014-10-18 LAB — POCT UA - MICROSCOPIC ONLY
Bacteria, U Microscopic: NEGATIVE
Bacteria, U Microscopic: NEGATIVE
CASTS, UR, LPF, POC: NEGATIVE
CRYSTALS, UR, HPF, POC: NEGATIVE
Crystals, Ur, HPF, POC: NEGATIVE
Epithelial cells, urine per micros: NEGATIVE
Mucus, UA: NEGATIVE
RBC, urine, microscopic: NEGATIVE
RBC, urine, microscopic: NEGATIVE
YEAST UA: NEGATIVE
YEAST UA: NEGATIVE

## 2014-10-18 MED ORDER — CYCLOBENZAPRINE HCL 10 MG PO TABS: 10.0000 mg | ORAL_TABLET | Freq: Every day | ORAL | Status: DC

## 2014-10-18 MED ORDER — MELOXICAM 15 MG PO TABS: 15.0000 mg | ORAL_TABLET | Freq: Every day | ORAL | Status: DC

## 2014-10-18 MED ORDER — IBUPROFEN 200 MG PO TABS: 800.0000 mg | ORAL_TABLET | Freq: Once | ORAL | Status: DC

## 2014-10-18 NOTE — Progress Notes (Signed)
Subjective:  This chart was scribed for Tami Lin MD, by Tamsen Roers, at [facility].  This patient was seen in room 3 and the patient's care was started at 9:30 AM.    Patient ID: Jacob Logan, male    DOB: 1950-10-07, 64 y.o.   MRN: [id number] Chief Complaint  Patient presents with  . Back Pain    x 1 week, hx of kidney stones  . Fever  . Dizziness    HPI  HPI Comments: Jacob Logan is a 64 y.o. male who presents to the [facility] for non radiating left sided flank pain onset 1 week ago and thinks it may be due to possible kidney stones.  Patient has a history of kidney stones and had one removed surgically 4-5 years ago.  His last episode was last summer where the stone was the size of a cashew when he passed it.  At that time, he received a shot of Toridol and passed the stone shortly after his office visit.   He also thinks his back pain could possibly be due to lifting heavy objects at work last week.  He has a history of scoliosis and is not able to lay down for long hours due. He denies any hematuria or dysuria.     There are no active problems to display for this patient.  Past Medical History  Diagnosis Date  . Kidney stone    Past Surgical History  Procedure Laterality Date  . Tonsillectomy    . Kidney stone surgery     No Known Allergies Prior to Admission medications   Not on File   History   Social History  . Marital Status: Unknown    Spouse Name: N/A  . Number of Children: N/A  . Years of Education: N/A   Occupational History  . Not on file.   Social History Main Topics  . Smoking status: Never Smoker   . Smokeless tobacco: Not on file  . Alcohol Use: 0.5 oz/week    1 drink(s) per week  . Drug Use: No  . Sexual Activity: No   Other Topics Concern  . Not on file   Social History Narrative      Review of Systems  Genitourinary: Positive for flank pain. Negative for hematuria and  difficulty urinating.  Musculoskeletal: Negative for neck pain and neck stiffness.       Objective:   Physical Exam  Constitutional: He is oriented to person, place, and time. He appears well-developed and well-nourished. No distress.  HENT:  Head: Normocephalic and atraumatic.  Eyes: Conjunctivae and EOM are normal.  Neck: Neck supple.  Cardiovascular: Normal rate.   Pulmonary/Chest: Effort normal. No respiratory distress.  Abdominal: There is no tenderness.  Musculoskeletal:  slr painful R at 65 Twist uncomf R lumbar nontend to palp but pos perc tend R flank Sign scoliosis curve R w/ elev R hip  Neurological: He is alert and oriented to person, place, and time.  Skin: Skin is warm and dry.  Psychiatric: He has a normal mood and affect. His behavior is normal.  Nursing note and vitals reviewed.   Filed Vitals:   10/18/14 0918  BP: 108/72  Pulse: 70  Temp: 98.9 F (37.2 C)  Resp: 16  Height: 5\' 10"  (1.778 m)  Weight: 138 lb (62.596 kg)  SpO2: 98%   Results for orders placed or performed in visit on 10/18/14  POCT urinalysis dipstick  Result  Value Ref Range   Color, UA yellow    Clarity, UA clear    Glucose, UA neg    Bilirubin, UA neg    Ketones, UA 15    Spec Grav, UA 1.010    Blood, UA tr-lysed    pH, UA 5.5    Protein, UA 30    Urobilinogen, UA 0.2    Nitrite, UA neg    Leukocytes, UA Negative   POCT UA - Microscopic Only  Result Value Ref Range   WBC, Ur, HPF, POC 0-5    RBC, urine, microscopic neg    Bacteria, U Microscopic neg    Mucus, UA neg    Epithelial cells, urine per micros neg    Crystals, Ur, HPF, POC neg    Casts, Ur, LPF, POC neg    Yeast, UA neg          Assessment & Plan:  I have completed the patient encounter in its entirety as documented by the scribe, with editing by me where necessary. Natallia Stellmach P. Laney Pastor, M.D.  Flank pain -lumbar pain prob LS str related to scoliosis and work but will watch for hematuria and colic over  next few days--scan if sxt progress  Meds ordered this encounter  Medications  . ibuprofen (ADVIL,MOTRIN) tablet 800 mg    Sig:   . cyclobenzaprine (FLEXERIL) 10 MG tablet    Sig: Take 1 tablet (10 mg total) by mouth at bedtime. For muscle spasm/relaxation    Dispense:  30 tablet    Refill:  0  . meloxicam (MOBIC) 15 MG tablet    Sig: Take 1 tablet (15 mg total) by mouth daily. For pain    Dispense:  30 tablet    Refill:  0   Exercises and heat for lumbar muscles F/u 2 w not well

## 2014-10-19 ENCOUNTER — Ambulatory Visit (INDEPENDENT_AMBULATORY_CARE_PROVIDER_SITE_OTHER): Payer: Commercial Managed Care - PPO | Admitting: Emergency Medicine

## 2014-10-19 VITALS — BP 92/70 | HR 74 | Temp 98.1°F | Resp 18 | Ht 70.0 in | Wt 139.2 lb

## 2014-10-19 DIAGNOSIS — R109 Unspecified abdominal pain: Secondary | ICD-10-CM

## 2014-10-19 DIAGNOSIS — N201 Calculus of ureter: Secondary | ICD-10-CM

## 2014-10-19 DIAGNOSIS — M4120 Other idiopathic scoliosis, site unspecified: Secondary | ICD-10-CM | POA: Diagnosis not present

## 2014-10-19 LAB — POCT URINALYSIS DIPSTICK
Bilirubin, UA: NEGATIVE
Blood, UA: NEGATIVE
Glucose, UA: NEGATIVE
Ketones, UA: 15
LEUKOCYTES UA: NEGATIVE
NITRITE UA: NEGATIVE
PH UA: 5.5
PROTEIN UA: 100
Spec Grav, UA: 1.025
Urobilinogen, UA: 0.2

## 2014-10-19 MED ORDER — NAPROXEN SODIUM 550 MG PO TABS: 550.0000 mg | ORAL_TABLET | Freq: Two times a day (BID) | ORAL | Status: DC

## 2014-10-19 MED ORDER — ACETAMINOPHEN-CODEINE #3 300-30 MG PO TABS: 1.0000 | ORAL_TABLET | ORAL | Status: DC | PRN

## 2014-10-19 NOTE — Progress Notes (Signed)
[facility] [address] [place] [address] [phone]  Date:  10/19/2014   Name:  Jacob Logan   DOB:  [birth year]   MRN:  [id number]  PCP:  No PCP Per Patient    Chief Complaint: Follow-up   History of Present Illness:  Sahas Sluka is a 64 y.o. very pleasant male patient who presents with the following:  Patient has a history of ureterolithiasis Seen yesterday in office for back pain. Has no history of GU symptoms or hematuria No fever or chills.   Low back pain not radiating. Says he cannot sleep due to pain. No history of injury or overuse No improvement with over the counter medications or other home remedies. Denies other complaint or health concern today.   Patient Active Problem List   Diagnosis Date Noted  . Ureterolithiasis 10/19/2014  . Scoliosis (and kyphoscoliosis), idiopathic 10/18/2014    Past Medical History  Diagnosis Date  . Kidney stone     Past Surgical History  Procedure Laterality Date  . Tonsillectomy    . Kidney stone surgery      History  Substance Use Topics  . Smoking status: Never Smoker   . Smokeless tobacco: Not on file  . Alcohol Use: 0.5 oz/week    1 drink(s) per week    Family History  Problem Relation Age of Onset  . Hypertension Mother   . Uterine cancer Mother   . Obesity Mother   . Hyperlipidemia Mother   . Heart disease Mother   . Diabetes Mother   . Cancer Mother   . Obesity Father   . Hypertension Father   . Diabetes Father   . Heart disease Father   . Hyperlipidemia Father   . Stroke Brother   . Obesity Brother   . Alzheimer's disease Brother   . Diabetes Brother   . Heart disease Brother   . Hyperlipidemia Brother   . Hypertension Brother   . Cataracts Maternal Grandmother     No Known Allergies  Medication list has been reviewed and updated.  Current Outpatient Prescriptions on File Prior to Visit  Medication Sig Dispense Refill  . cyclobenzaprine (FLEXERIL) 10 MG  tablet Take 1 tablet (10 mg total) by mouth at bedtime. For muscle spasm/relaxation 30 tablet 0  . meloxicam (MOBIC) 15 MG tablet Take 1 tablet (15 mg total) by mouth daily. For pain 30 tablet 0   Current Facility-Administered Medications on File Prior to Visit  Medication Dose Route Frequency Provider Last Rate Last Dose  . ibuprofen (ADVIL,MOTRIN) tablet 800 mg  800 mg Oral Once Leandrew Koyanagi, MD        Review of Systems:  Review of Systems  Constitutional: Negative for fever, chills and fatigue.  HENT: Negative for congestion, ear pain, hearing loss, postnasal drip, rhinorrhea and sinus pressure.   Eyes: Negative for discharge and redness.  Respiratory: Negative for cough, shortness of breath and wheezing.   Cardiovascular: Negative for chest pain and leg swelling.  Gastrointestinal: Negative for nausea, vomiting, abdominal pain, constipation and blood in stool.  Genitourinary: Negative for dysuria, urgency and frequency.  Musculoskeletal: Negative for neck stiffness.  Skin: Negative for rash.  Neurological: Negative for seizures, weakness and headaches.   Physical Examination: Filed Vitals:   10/19/14 0916  BP: 92/70  Pulse: 74  Temp: 98.1 F (36.7 C)  Resp: 18   Filed Vitals:   10/19/14 0916  Height: 5\' 10"  (1.778 m)  Weight: 139 lb  3.2 oz (63.141 kg)   Body mass index is 19.97 kg/(m^2). Ideal Body Weight: Weight in (lb) to have BMI = 25: 173.9   GEN: WDWN, NAD, Non-toxic, Alert & Oriented x 3 HEENT: Atraumatic, Normocephalic.  Ears and Nose: No external deformity. EXTR: No clubbing/cyanosis/edema NEURO: Normal gait.  BACK:  Right lumbar para spinous muscle tenderness PSYCH: Normally interactive. Conversant. Not depressed or anxious appearing.  Calm demeanor.    Assessment and Plan: 1. Exclude Ureterolithiasis Negative for blood - POCT UA - Microscopic Only - POCT urinalysis dipstick  2. Flank pain Has tenderness in right back - POCT UA - Microscopic  Only - POCT urinalysis dipstick  3. Scoliosis (and kyphoscoliosis), idiopathic Likely cause of pain - POCT UA - Microscopic Only - POCT urinalysis dipstick  Anaprox Tylenol #3   Signed Ellison Carwin, MD   Results for orders placed or performed in visit on 10/19/14  POCT UA - Microscopic Only  Result Value Ref Range   WBC, Ur, HPF, POC 2-5    RBC, urine, microscopic neg    Bacteria, U Microscopic neg    Mucus, UA trace    Epithelial cells, urine per micros 1-3    Crystals, Ur, HPF, POC neg    Casts, Ur, LPF, POC cellular cast 0-1    Yeast, UA neg   POCT urinalysis dipstick  Result Value Ref Range   Color, UA yellow    Clarity, UA clear    Glucose, UA neg    Bilirubin, UA neg    Ketones, UA 15    Spec Grav, UA 1.025    Blood, UA neg    pH, UA 5.5    Protein, UA 100    Urobilinogen, UA 0.2    Nitrite, UA neg    Leukocytes, UA Negative

## 2014-10-19 NOTE — Patient Instructions (Signed)

## 2014-11-03 ENCOUNTER — Ambulatory Visit (HOSPITAL_COMMUNITY)
Admission: RE | Admit: 2014-11-03 | Discharge: 2014-11-03 | Disposition: A | Discharge status: Home / Self Care | Payer: Commercial Managed Care - PPO | Source: Ambulatory Visit | Attending: Family Medicine | Admitting: Family Medicine

## 2014-11-03 ENCOUNTER — Ambulatory Visit (INDEPENDENT_AMBULATORY_CARE_PROVIDER_SITE_OTHER): Payer: Commercial Managed Care - PPO | Admitting: Family Medicine

## 2014-11-03 ENCOUNTER — Ambulatory Visit (INDEPENDENT_AMBULATORY_CARE_PROVIDER_SITE_OTHER): Payer: Commercial Managed Care - PPO

## 2014-11-03 ENCOUNTER — Telehealth: Payer: Self-pay | Admitting: *Deleted

## 2014-11-03 ENCOUNTER — Other Ambulatory Visit: Payer: Self-pay | Admitting: *Deleted

## 2014-11-03 VITALS — BP 116/74 | HR 67 | Temp 98.4°F | Resp 16 | Ht 70.0 in | Wt 137.0 lb

## 2014-11-03 DIAGNOSIS — Z5329 Procedure and treatment not carried out because of patient's decision for other reasons: Secondary | ICD-10-CM | POA: Diagnosis not present

## 2014-11-03 DIAGNOSIS — F4321 Adjustment disorder with depressed mood: Secondary | ICD-10-CM

## 2014-11-03 DIAGNOSIS — R5383 Other fatigue: Secondary | ICD-10-CM

## 2014-11-03 DIAGNOSIS — M79606 Pain in leg, unspecified: Secondary | ICD-10-CM | POA: Insufficient documentation

## 2014-11-03 DIAGNOSIS — Z125 Encounter for screening for malignant neoplasm of prostate: Secondary | ICD-10-CM

## 2014-11-03 DIAGNOSIS — R600 Localized edema: Secondary | ICD-10-CM

## 2014-11-03 DIAGNOSIS — K649 Unspecified hemorrhoids: Secondary | ICD-10-CM | POA: Diagnosis not present

## 2014-11-03 DIAGNOSIS — R05 Cough: Secondary | ICD-10-CM

## 2014-11-03 DIAGNOSIS — R350 Frequency of micturition: Secondary | ICD-10-CM | POA: Diagnosis not present

## 2014-11-03 DIAGNOSIS — R059 Cough, unspecified: Secondary | ICD-10-CM

## 2014-11-03 DIAGNOSIS — N200 Calculus of kidney: Secondary | ICD-10-CM

## 2014-11-03 DIAGNOSIS — R0602 Shortness of breath: Secondary | ICD-10-CM

## 2014-11-03 DIAGNOSIS — R609 Edema, unspecified: Secondary | ICD-10-CM

## 2014-11-03 DIAGNOSIS — M7989 Other specified soft tissue disorders: Secondary | ICD-10-CM | POA: Insufficient documentation

## 2014-11-03 DIAGNOSIS — D649 Anemia, unspecified: Secondary | ICD-10-CM | POA: Diagnosis not present

## 2014-11-03 LAB — COMPLETE METABOLIC PANEL WITHOUT GFR
ALT: 30 U/L (ref 0–53)
Albumin: 3.5 g/dL (ref 3.5–5.2)
Alkaline Phosphatase: 92 U/L (ref 39–117)
BUN: 21 mg/dL (ref 6–23)
CO2: 26 meq/L (ref 19–32)
Calcium: 8.3 mg/dL — ABNORMAL LOW (ref 8.4–10.5)
Chloride: 95 meq/L — ABNORMAL LOW (ref 96–112)
GFR, Est Non African American: 50 mL/min — ABNORMAL LOW
Sodium: 130 meq/L — ABNORMAL LOW (ref 135–145)
Total Protein: 7.1 g/dL (ref 6.0–8.3)

## 2014-11-03 LAB — COMPLETE METABOLIC PANEL WITH GFR
AST: 28 U/L (ref 0–37)
Creat: 1.49 mg/dL — ABNORMAL HIGH (ref 0.50–1.35)
GFR, Est African American: 57 mL/min — ABNORMAL LOW
Glucose, Bld: 92 mg/dL (ref 70–99)
Potassium: 4 mEq/L (ref 3.5–5.3)
Total Bilirubin: 0.8 mg/dL (ref 0.2–1.2)

## 2014-11-03 LAB — POCT URINALYSIS DIPSTICK
Bilirubin, UA: NEGATIVE
Glucose, UA: NEGATIVE
Ketones, UA: NEGATIVE
Leukocytes, UA: NEGATIVE
Nitrite, UA: NEGATIVE
Protein, UA: NEGATIVE
Spec Grav, UA: 1.02
Urobilinogen, UA: 0.2
pH, UA: 6

## 2014-11-03 LAB — POCT UA - MICROSCOPIC ONLY
Casts, Ur, LPF, POC: NEGATIVE
Crystals, Ur, HPF, POC: NEGATIVE
Mucus, UA: NEGATIVE
Yeast, UA: NEGATIVE

## 2014-11-03 LAB — POCT CBC
Granulocyte percent: 82 % — AB (ref 37–80)
HCT, POC: 35 % — AB (ref 43.5–53.7)
Hemoglobin: 11.6 g/dL — AB (ref 14.1–18.1)
Lymph, poc: 1 (ref 0.6–3.4)
MCH, POC: 28.3 pg (ref 27–31.2)
MCHC: 33 g/dL (ref 31.8–35.4)
MCV: 85.7 fL (ref 80–97)
MID (cbc): 0.5 (ref 0–0.9)
MPV: 7 fL (ref 0–99.8)
POC Granulocyte: 7.1 — AB (ref 2–6.9)
POC LYMPH PERCENT: 11.7 % (ref 10–50)
POC MID %: 6.3 %M (ref 0–12)
Platelet Count, POC: 378 10*3/uL (ref 142–424)
RBC: 4.09 M/uL — AB (ref 4.69–6.13)
RDW, POC: 13.8 %
WBC: 8.7 10*3/uL (ref 4.6–10.2)

## 2014-11-03 LAB — IRON AND TIBC
%SAT: 8 % — ABNORMAL LOW (ref 20–55)
Iron: 15 ug/dL — ABNORMAL LOW (ref 42–165)
TIBC: 198 ug/dL — ABNORMAL LOW (ref 215–435)
UIBC: 183 ug/dL (ref 125–400)

## 2014-11-03 LAB — FERRITIN: Ferritin: 388 ng/mL — ABNORMAL HIGH (ref 22–322)

## 2014-11-03 LAB — TSH: TSH: 2.272 u[IU]/mL (ref 0.350–4.500)

## 2014-11-03 MED ORDER — TAMSULOSIN HCL 0.4 MG PO CAPS: 0.4000 mg | ORAL_CAPSULE | Freq: Every day | ORAL | Status: DC

## 2014-11-03 NOTE — Progress Notes (Signed)
Chief Complaint:  Chief Complaint  Patient presents with  . Joint Swelling    in both ankles, x 1 day  . Cough    x 2 months  . Shortness of Breath  . Fatigue  . Depression    triage screening    HPI: Jacob Logan is a 64 y.o. male who is here for multiple reasons. This includes bilateral ankle swelling in the past 1 day. Associated with shortness of breath that has been chronic due to a two-month history of cough. He is also had fatigue. He was screened for depression and has depression symptoms but doesn't not want any medications. He does not want any counseling. He is trying to take vitamins and that has helped.  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  HE has been doing this for 8 years.  He has had SOB with running for over 1 year, with walking and adily acitvities is new for the last 3 months Coughing up a lot several months ago but still has cough from several months ago.  Ankle swelling since last week and has been elevating and it helps.  He ha smore ankle swelling on the right, no right ankle injury, he has varicose veins.  Low/no risk factors of PE/DVT. Denies recent trauma/surgeires, hx of DVT/PE, long car or plane rides, sedentary lifestyle, or malignancy NO prior history of prostate cancer, PSA was normal when he last checked. No urinary symptoms. He has to go frequently but not much comes out, He ahs a lsight left back pain and some ache in the groin. 3/10 aching pain, just enough hat he knows it is there.  He has had chills most of the time, he finds that he is having night sweats.  He is pretty much a loner, he does not have a car and could not get it repaired He is not getting another one, he is stressed financially He does not have family anywhere, he has a long time roommate, she  is male. He is not sexually active with her. She thinks they are more than just roommates but to him they are just roommates. He is originally from [place] but moved around a lot. His last state of residence was [place].  He has issues with his urine flow. Stream is not odd, dribble, takes a long time to get nothing out.  He goes to the bathroo every 2 hours. As has been a chronic issue.  BP Readings from Last 3 Encounters:  11/03/14 116/74  10/19/14 92/70  10/18/14 108/72   SpO2 Readings from Last 3 Encounters:  11/03/14 98%  10/19/14 96%  10/18/14 98%   Wt Readings from Last 3 Encounters:  11/03/14 137 lb (62.143 kg)  10/19/14 139 lb 3.2 oz (63.141 kg)  10/18/14 138 lb (62.596 kg)       Past Medical History  Diagnosis Date  . Kidney stone    Past Surgical History  Procedure Laterality Date  . Tonsillectomy    . Kidney stone surgery     History   Social History  . Marital Status: Unknown    Spouse Name: N/A  . Number of Children: N/A  . Years of Education: N/A   Social History Main Topics  . Smoking  status: Never Smoker   . Smokeless tobacco: Not on file  . Alcohol Use: 0.5 oz/week    1 drink(s) per week  . Drug Use: No  . Sexual Activity: No   Other Topics Concern  . None   Social History Narrative   Family History  Problem Relation Age of Onset  . Hypertension Mother   . Uterine cancer Mother   . Obesity Mother   . Hyperlipidemia Mother   . Heart disease Mother   . Diabetes Mother   . Cancer Mother   . Obesity Father   . Hypertension Father   . Diabetes Father   . Heart disease Father   . Hyperlipidemia Father   . Stroke Brother   . Obesity Brother   . Alzheimer's disease Brother   . Diabetes Brother   . Heart disease Brother   . Hyperlipidemia Brother   . Hypertension Brother   . Cataracts Maternal Grandmother    No Known Allergies Prior to Admission medications   Medication Sig Start Date End Date Taking? Authorizing Provider    acetaminophen-codeine (TYLENOL #3) 300-30 MG per tablet Take 1-2 tablets by mouth every 4 (four) hours as needed. 10/19/14  Yes Roselee Culver, MD  cyclobenzaprine (FLEXERIL) 10 MG tablet Take 1 tablet (10 mg total) by mouth at bedtime. For muscle spasm/relaxation 10/18/14  Yes Leandrew Koyanagi, MD  meloxicam (MOBIC) 15 MG tablet Take 15 mg by mouth daily.   Yes Historical Provider, MD  naproxen sodium (ANAPROX DS) 550 MG tablet Take 1 tablet (550 mg total) by mouth 2 (two) times daily with a meal. 10/19/14 10/19/15 Yes Roselee Culver, MD     ROS: The patient denies fevers, chills, night sweats, unintentional weight loss, chest pain, palpitations, wheezing, dyspnea on exertion, nausea, vomiting, abdominal pain, dysuria, hematuria, melena, numbness, weakness, or tingling.   All other systems have been reviewed and were otherwise negative with the exception of those mentioned in the HPI and as above.    PHYSICAL EXAM: Filed Vitals:   11/03/14 0915  BP: 116/74  Pulse: 67  Temp: 98.4 F (36.9 C)  Resp: 16   Filed Vitals:   11/03/14 0915  Height: 5\' 10"  (1.778 m)  Weight: 137 lb (62.143 kg)   Body mass index is 19.66 kg/(m^2).  General: Alert, no acute distress HEENT:  Normocephalic, atraumatic, oropharynx patent. EOMI, PERRLA no appreciable thyroid megaly, TM normal, Cardiovascular:  Regular rate and rhythm, no rubs murmurs or gallops.  No Carotid bruits, radial pulse intact. + pedal edema.  Respiratory: Clear to auscultation bilaterally.  No wheezes, rales, or rhonchi.  No cyanosis, no use of accessory musculature GI: No organomegaly, abdomen is soft and non-tender, positive bowel sounds.  No masses. Skin: No rashes. Neurologic: Facial musculature symmetric. Psychiatric: Patient is appropriate throughout our interaction. Lymphatic: No cervical lymphadenopathy Musculoskeletal: Gait intact. Affect is flat, he is a thin Caucasian male, there is negative Homans sign. However there  is increased edema on the right side versus left. There is bilateral edema but nonpitting. Radial pulses. Good bilateral dorsalis pedis pulses and also posterior tibial artery pulses. Positive varicosities. Large tender external hemorrhoid, is not hard.  Prostate is normal.   LABS: Results for orders placed or performed in visit on 11/03/14  POCT CBC  Result Value Ref Range   WBC 8.7 4.6 - 10.2 K/uL   Lymph, poc 1.0 0.6 - 3.4   POC LYMPH PERCENT 11.7 10 - 50 %L   MID (  cbc) 0.5 0 - 0.9   POC MID % 6.3 0 - 12 %M   POC Granulocyte 7.1 (A) 2 - 6.9   Granulocyte percent 82.0 (A) 37 - 80 %G   RBC 4.09 (A) 4.69 - 6.13 M/uL   Hemoglobin 11.6 (A) 14.1 - 18.1 g/dL   HCT, POC 35.0 (A) 43.5 - 53.7 %   MCV 85.7 80 - 97 fL   MCH, POC 28.3 27 - 31.2 pg   MCHC 33.0 31.8 - 35.4 g/dL   RDW, POC 13.8 %   Platelet Count, POC 378 142 - 424 K/uL   MPV 7.0 0 - 99.8 fL  POCT UA - Microscopic Only  Result Value Ref Range   WBC, Ur, HPF, POC 0-4    RBC, urine, microscopic 1-4    Bacteria, U Microscopic trace    Mucus, UA neg    Epithelial cells, urine per micros 0-2    Crystals, Ur, HPF, POC neg    Casts, Ur, LPF, POC neg    Yeast, UA neg   POCT urinalysis dipstick  Result Value Ref Range   Color, UA yellow    Clarity, UA clear    Glucose, UA neg    Bilirubin, UA neg    Ketones, UA neg    Spec Grav, UA 1.020    Blood, UA moderate    pH, UA 6.0    Protein, UA neg    Urobilinogen, UA 0.2    Nitrite, UA neg    Leukocytes, UA Negative      EKG/XRAY:   Primary read interpreted by Dr. Marin Comment at [facility]. Neg for any acute cardiopulmonary process   ASSESSMENT/PLAN: Encounter Diagnoses  Name Primary?  . Other fatigue Yes  . SOB (shortness of breath)   . Edema of right lower extremity   . Adjustment disorder with depressed mood   . Cough   . Increased urinary frequency   . Screening for prostate cancer   . Anemia, unspecified anemia type   . Hemorrhoids, unspecified hemorrhoid type   .  Renal stones    64 year old Caucasian male with a past medical history of renal stones who presents with acute on chronic history of shortness of breath with bilateral lower extremity edema, right worse than left, fatigue, depression symptoms Rule out DVT. Stat lower extremity ultrasound today Chest x-ray was within normal limits We will also screening for prostate cancer. He has had issues with increased urinary frequency, dribbling, decrease flow but prostate exam did not show any hypertrophy..  He does not sleep much at night which may be contributing to his fatigue. He has about 2-3 hours of sleep for the last 8 years. 2 jobs back-to-back.. I suspect this i and the anemia are really the cause of his fatigue. But we will get basic labs including a thyroid test. Prescribed Flomax to see if this will help with his urinary symptoms and a allow for any ureter stones to move. If this doesn't help and his symptoms are worse we will get a CT abdomen and pelvis to rule out renal stones. Iron studies pending labs pending Referred to Dr. Earlean Shawl for external hemorrhoid consultation He declines to be on any medications for his depressed mood. Follow-up after labs and Doppler by phone.   Gross sideeffects, risk and benefits, and alternatives of medications d/w patient. Patient is aware that all medications have potential sideeffects and we are unable to predict every sideeffect or drug-drug interaction that may occur.  Evangelos Paulino  [place], DO 11/03/2014 12:35 PM

## 2014-11-03 NOTE — Progress Notes (Signed)
*  PRELIMINARY RESULTS* Vascular Ultrasound Lower extremity venous duplex has been completed.  Preliminary findings: negative for DVT.  Called results to Dr. Gus Puma office.   Landry Mellow, RDMS, RVT  11/03/2014, 1:24 PM

## 2014-11-03 NOTE — Patient Instructions (Signed)
Please report to [facility] at 12:45 pm today. **Go to the [employer] and go to registration to register. Let them know you have an scheduled Venous doppler. Someone will get you from there.

## 2014-11-03 NOTE — Telephone Encounter (Signed)
[facility] called regarding this pt.  His results are negative for DVT.

## 2014-11-04 LAB — PSA: PSA: 2.12 ng/mL (ref <=4.00)

## 2014-11-04 NOTE — Telephone Encounter (Signed)
Discussed with patient labs. Also his negative Doppler exam. He has iron deficiency anemia, needs to take supplemental iron. However if he is going to take iron and then he needs to be on a stool softer. He has significant external hemorrhoid which may be contributing to his anemia as well. I have referred him to Dr. Earlean Shawl for outpatient hemorrhoid management discussion. Additionally his sodium is low and his creatinine is high. I'm not sure if this is related to a kidney stone or poor by mouth intake. He does not eat well. He only eats one meal a day with one snack. Does not drink very much. He is currently on Flomax 1 dose and he does not have any of his back pain like he did before. His urination is still poor. We will recheck in 24 hours. If his symptoms do not improve and I will get a CT scan of abdomen and pelvis and for a repeat BMP. He was okay with this plan. He will let me know if things worsen and can go to the ER as needed

## 2014-11-05 ENCOUNTER — Telehealth: Payer: Self-pay

## 2014-11-05 NOTE — Telephone Encounter (Signed)
Patient calling per Dr. Gus Puma instruction.   He would elaborate on the nature of the call.  [phone] (H)

## 2014-11-06 NOTE — Telephone Encounter (Signed)
Unable to leave message

## 2014-11-11 ENCOUNTER — Telehealth: Payer: Self-pay | Admitting: Family Medicine

## 2014-11-11 DIAGNOSIS — M4622 Osteomyelitis of vertebra, cervical region: Secondary | ICD-10-CM

## 2014-11-11 HISTORY — DX: Osteomyelitis of vertebra, cervical region: M46.22

## 2014-11-11 NOTE — Telephone Encounter (Signed)
He is in Dr [facility] office now, will call me back

## 2014-11-12 ENCOUNTER — Telehealth: Payer: Self-pay | Admitting: *Deleted

## 2014-11-12 DIAGNOSIS — R0989 Other specified symptoms and signs involving the circulatory and respiratory systems: Secondary | ICD-10-CM

## 2014-11-12 NOTE — Telephone Encounter (Signed)
Pt called stating he was returning Dr. Marin Comment call.  I told the pt that Dr. Marin Comment was in an room with an patient.  He stated that was fine and she could call him back whenever she was available.

## 2014-11-16 NOTE — Telephone Encounter (Signed)
Spoke with patient again about labs. He is feeling better in some respects, flow is better and no flank abd pain, he has to urinate a lo he has improved Ankle swelling. He has abnormal electrolytes and also cbc so will return for repate lasb. I have advise him to take iron supplements daily, he ahs seen Dr Earlean Shawl for severe hemorrhoids,  Needs colonscopy as well.  He now has neck pain and shoulde rpain. He was told Dr Earlean Shawl heard a carotid buti so I will go ahead and get a carotid doppler for his neck , he will come back for follow-up.

## 2014-11-17 ENCOUNTER — Telehealth: Payer: Self-pay | Admitting: *Deleted

## 2014-11-17 ENCOUNTER — Ambulatory Visit (INDEPENDENT_AMBULATORY_CARE_PROVIDER_SITE_OTHER): Payer: Commercial Managed Care - PPO | Admitting: Family Medicine

## 2014-11-17 ENCOUNTER — Ambulatory Visit (INDEPENDENT_AMBULATORY_CARE_PROVIDER_SITE_OTHER): Payer: Commercial Managed Care - PPO

## 2014-11-17 VITALS — BP 114/73 | HR 88 | Temp 98.8°F | Resp 18 | Wt 136.0 lb

## 2014-11-17 DIAGNOSIS — R748 Abnormal levels of other serum enzymes: Secondary | ICD-10-CM | POA: Diagnosis not present

## 2014-11-17 DIAGNOSIS — R3989 Other symptoms and signs involving the genitourinary system: Secondary | ICD-10-CM

## 2014-11-17 DIAGNOSIS — R39198 Other difficulties with micturition: Secondary | ICD-10-CM

## 2014-11-17 DIAGNOSIS — E871 Hypo-osmolality and hyponatremia: Secondary | ICD-10-CM

## 2014-11-17 DIAGNOSIS — R937 Abnormal findings on diagnostic imaging of other parts of musculoskeletal system: Secondary | ICD-10-CM | POA: Diagnosis not present

## 2014-11-17 DIAGNOSIS — M542 Cervicalgia: Secondary | ICD-10-CM

## 2014-11-17 DIAGNOSIS — R350 Frequency of micturition: Secondary | ICD-10-CM

## 2014-11-17 DIAGNOSIS — D649 Anemia, unspecified: Secondary | ICD-10-CM | POA: Diagnosis not present

## 2014-11-17 DIAGNOSIS — R7989 Other specified abnormal findings of blood chemistry: Secondary | ICD-10-CM

## 2014-11-17 LAB — POCT URINALYSIS DIPSTICK
BILIRUBIN UA: NEGATIVE
Glucose, UA: NEGATIVE
Ketones, UA: NEGATIVE
Nitrite, UA: NEGATIVE
Protein, UA: NEGATIVE
SPEC GRAV UA: 1.02
Urobilinogen, UA: 0.2
pH, UA: 7

## 2014-11-17 LAB — BASIC METABOLIC PANEL
BUN: 19 mg/dL (ref 6–23)
CHLORIDE: 101 meq/L (ref 96–112)
CO2: 27 mEq/L (ref 19–32)
Calcium: 8.8 mg/dL (ref 8.4–10.5)
Creat: 1.24 mg/dL (ref 0.50–1.35)
GLUCOSE: 93 mg/dL (ref 70–99)
Potassium: 4.5 mEq/L (ref 3.5–5.3)
Sodium: 137 mEq/L (ref 135–145)

## 2014-11-17 LAB — POCT CBC
GRANULOCYTE PERCENT: 77.5 % (ref 37–80)
HCT, POC: 32.2 % — AB (ref 43.5–53.7)
Hemoglobin: 10.3 g/dL — AB (ref 14.1–18.1)
Lymph, poc: 1.2 (ref 0.6–3.4)
MCH, POC: 27.5 pg (ref 27–31.2)
MCHC: 32.2 g/dL (ref 31.8–35.4)
MCV: 85.6 fL (ref 80–97)
MID (cbc): 0.5 (ref 0–0.9)
MPV: 6.1 fL (ref 0–99.8)
POC Granulocyte: 6.1 (ref 2–6.9)
POC LYMPH %: 15.8 % (ref 10–50)
POC MID %: 6.7 % (ref 0–12)
Platelet Count, POC: 394 10*3/uL (ref 142–424)
RBC: 3.76 M/uL — AB (ref 4.69–6.13)
RDW, POC: 13.7 %
WBC: 7.9 10*3/uL (ref 4.6–10.2)

## 2014-11-17 LAB — POCT UA - MICROSCOPIC ONLY
BACTERIA, U MICROSCOPIC: NEGATIVE
CASTS, UR, LPF, POC: NEGATIVE
CRYSTALS, UR, HPF, POC: NEGATIVE
EPITHELIAL CELLS, URINE PER MICROSCOPY: NEGATIVE
MUCUS UA: NEGATIVE
Yeast, UA: NEGATIVE

## 2014-11-17 MED ORDER — CIPROFLOXACIN HCL 500 MG PO TABS: 500.0000 mg | ORAL_TABLET | Freq: Two times a day (BID) | ORAL | Status: DC

## 2014-11-17 MED ORDER — HYDROCODONE-ACETAMINOPHEN 5-325 MG PO TABS
1.0000 | ORAL_TABLET | Freq: Four times a day (QID) | ORAL | Status: DC | PRN
Start: 2014-11-17 — End: 2015-03-29

## 2014-11-17 NOTE — Progress Notes (Signed)
Subjective:  This chart was scribed for Jacob Ray, MD by [facility], medical scribe at [facility].The patient was seen in exam room 03 and the patient's care was started at 9:21 AM.   Patient ID: Jacob Logan, male    DOB: 26-Sep-1950, 64 y.o.   MRN: [id number] Chief Complaint  Patient presents with  . Neck Pain  . Shoulder Pain    bilateral  . Depression    positive depression screening   HPI HPI Comments: Jacob Logan is a 64 y.o. male who presents to [facility] complaining of neck pain and bilateral shoulder pain. He was seen May 24 th with multiple concerns that day. He was having fatigue and adjustment disorder at that time. He declined medication for depressed mood at that time. Pt works at a hotel.  Neck, shoulder pain: Neck and bilateral shoulder which began 10-11 days ago, no known injury. He has pain on the left or right side with certain movements. The pain radiates to the upper arms bilaterally. Tried ice, heat, and ROM motion exercises for minor relief. Also tried ibuprofen but told to stop taking because of loss of blood. Prior to meeting his GI specialist he was taking 1-2 pills three times a day for roughly a couple days. Not taking any other NSAID's. He has a history of scoliosis. No unexpected weight loss.  Positive depression screening: Pt states he feels down most of the time but no SI or self injury. He declines counseling or medication at this visit. Depression screen [facility] 2/9 11/17/2014 11/03/2014 10/18/2014  Decreased Interest 0 3 0  Down, Depressed, Hopeless 2 3 0  PHQ - 2 Score 2 6 0  Altered sleeping 0 0 -  Tired, decreased energy 1 3 -  Change in appetite 0 0 -  Feeling bad or failure about yourself  3 3 -  Trouble concentrating 0 0 -  Moving slowly or fidgety/restless 1 3 -  Suicidal thoughts 0 0 -  PHQ-9 Score 7 15 -  Difficult doing work/chores Not difficult at all Not difficult at all -   Abnormal  Labs: See last telephone note. Hemoglobin was low at 11.6, Iron was low, Ferritin was high at 388. Sodium was low at 130, creatinine was elevated at 1.49. P has an urgency to urinate every 1.5-2 hours, but very little output when he does urinate; this has been a chronic issue. He is still urinating small amounts and frequently. Slightly improved with Flomax.   Lab Results  Component Value Date   PSA 2.12 11/03/2014   PSA 1.33 05/18/2013   Patient Active Problem List   Diagnosis Date Noted  . Ureterolithiasis 10/19/2014  . Scoliosis (and kyphoscoliosis), idiopathic 10/18/2014   Past Medical History  Diagnosis Date  . Kidney stone    Past Surgical History  Procedure Laterality Date  . Tonsillectomy    . Kidney stone surgery     No Known Allergies Prior to Admission medications   Medication Sig Start Date End Date Taking? Authorizing Provider  tamsulosin (FLOMAX) 0.4 MG CAPS capsule Take 1 capsule (0.4 mg total) by mouth daily. 11/03/14  Yes Thao P Le, DO  acetaminophen-codeine (TYLENOL #3) 300-30 MG per tablet Take 1-2 tablets by mouth every 4 (four) hours as needed. Patient not taking: Reported on 11/17/2014 10/19/14   Roselee Culver, MD   History   Social History  . Marital Status: Unknown    Spouse Name: N/A  .  Number of Children: N/A  . Years of Education: N/A   Occupational History  . Not on file.   Social History Main Topics  . Smoking status: Never Smoker   . Smokeless tobacco: Not on file  . Alcohol Use: 0.5 oz/week    1 drink(s) per week  . Drug Use: No  . Sexual Activity: No   Other Topics Concern  . Not on file   Social History Narrative   Review of Systems  Genitourinary: Positive for urgency, decreased urine volume and difficulty urinating.  Musculoskeletal: Positive for arthralgias, neck pain and neck stiffness.  Psychiatric/Behavioral: Positive for dysphoric mood. Negative for suicidal ideas and self-injury.      Objective:  BP 114/73 mmHg   Pulse 88  Temp(Src) 98.8 F (37.1 C) (Oral)  Resp 18  Wt 136 lb (61.689 kg)  SpO2 98% Physical Exam  Constitutional: He is oriented to person, place, and time. He appears well-developed and well-nourished. No distress.  Thin appearance.  HENT:  Head: Normocephalic and atraumatic.  Eyes: Pupils are equal, round, and reactive to light.  Neck: Normal range of motion.  Tenderness over the right rhomboid and lower cervical spine. Slight spasms over the perispinal muscles of the neck. Decreased extension only approximately  15 degrees. Lateral flexion is diminished to 45 degrees bilaterally. Minimal left lateral flexion and unable to flex laterally to the right.  Cardiovascular: Normal rate and regular rhythm.   Pulmonary/Chest: Effort normal and breath sounds normal. No respiratory distress. He has no wheezes.  Right curve scoliosis on the back.  Abdominal: Soft. He exhibits no distension. There is no CVA tenderness.  Musculoskeletal: Normal range of motion.  Neurological: He is alert and oriented to person, place, and time.  Reflex Scores:      Tricep reflexes are 2+ on the right side and 2+ on the left side.      Bicep reflexes are 2+ on the right side and 2+ on the left side.      Brachioradialis reflexes are 2+ on the right side and 2+ on the left side. equal grip strength, strength of the upper extremities are equal.  Skin: Skin is warm and dry.  Psychiatric: He has a normal mood and affect. His behavior is normal.  Nursing note and vitals reviewed.  [facility] reading (PRIMARY) by Dr.Davetta Olliff: C-spine shows diffuse degenerative changes with C-4 and C-5 severe degenerative change versus underlying degradation of bone and appears to be fused.  Results for orders placed or performed in visit on 11/17/14  POCT CBC  Result Value Ref Range   WBC 7.9 4.6 - 10.2 K/uL   Lymph, poc 1.2 0.6 - 3.4   POC LYMPH PERCENT 15.8 10 - 50 %L   MID (cbc) 0.5 0 - 0.9   POC MID % 6.7 0 - 12 %M   POC  Granulocyte 6.1 2 - 6.9   Granulocyte percent 77.5 37 - 80 %G   RBC 3.76 (A) 4.69 - 6.13 M/uL   Hemoglobin 10.3 (A) 14.1 - 18.1 g/dL   HCT, POC 32.2 (A) 43.5 - 53.7 %   MCV 85.6 80 - 97 fL   MCH, POC 27.5 27 - 31.2 pg   MCHC 32.2 31.8 - 35.4 g/dL   RDW, POC 13.7 %   Platelet Count, POC 394 142 - 424 K/uL   MPV 6.1 0 - 99.8 fL  POCT urinalysis dipstick  Result Value Ref Range   Color, UA yellow    Clarity, UA  clear    Glucose, UA neg    Bilirubin, UA neg    Ketones, UA neg    Spec Grav, UA 1.020    Blood, UA tr-intact    pH, UA 7.0    Protein, UA neg    Urobilinogen, UA 0.2    Nitrite, UA neg    Leukocytes, UA Trace   POCT UA - Microscopic Only  Result Value Ref Range   WBC, Ur, HPF, POC 4-10    RBC, urine, microscopic 1-4    Bacteria, U Microscopic neg    Mucus, UA neg    Epithelial cells, urine per micros neg    Crystals, Ur, HPF, POC neg    Casts, Ur, LPF, POC neg    Yeast, UA neg       Assessment & Plan:   Krystle Oberman is a 64 y.o. male Hyponatremia - Plan: Basic metabolic panel  - repeat BMP.   Elevated serum creatinine - Plan: Basic metabolic panel -repeat BMP  Anemia, unspecified anemia type - Plan: POCT CBC  - slightly lower than previous OV. recent start of iron, increase to BID. Recommended proceeding with colonoscopy/endoscopy as planned with GI and recheck CBC in 4 days.  RTC/ER precautions.  Neck pain, bilateral - Abnormal x-Logan of cervical spine - Plan: DG Cervical Spine Complete, HYDROcodone-acetaminophen (NORCO/VICODIN) 5-325 MG per tablet, MR Cervical Spine W Wo Contrast,   - Erosive changes noted on C-spine x-Logan. Recent significant neck pain but without changes past few days. Will check MRI to rule out discitis or other abnormal mass within the vertebrae of the cervical spine. Lortab as needed, side effects discussed.  Urinary frequency, Difficulty urinating -  Plan: Ambulatory referral to Urology, POCT urinalysis dipstick, POCT UA - Microscopic  Only, Urine culture, ciprofloxacin (CIPRO) 500 MG tablet, PSA  -Repeat PSA, urine culture, but as WBC on urinalysis in office will start Cipro for now. Side effects discussed and risks of tendinopathy discussed.   - Also refer to urology for chronic urinary frequency and small amounts.   Depression. Again discussed counseling or medication for his symptoms as noted above in PHQ screening.  He declined both of these at this time. Advised let me know if he changes his mind on either of these options.   Follow-up in 4 days.   Meds ordered this encounter  Medications  . HYDROcodone-acetaminophen (NORCO/VICODIN) 5-325 MG per tablet    Sig: Take 1 tablet by mouth every 6 (six) hours as needed for moderate pain.    Dispense:  15 tablet    Refill:  0  . ciprofloxacin (CIPRO) 500 MG tablet    Sig: Take 1 tablet (500 mg total) by mouth 2 (two) times daily.    Dispense:  20 tablet    Refill:  0   Patient Instructions  We will arrange for MRI of your spine in next few days. Hydrocodone if needed for pain.  Blood count is lower today - iron twice per day and recheck in 4 days with me.  Need to schedule endoscopy and colonoscopy as discussed.  You should receive a call or letter about your lab results within the next week to 10 days.  Start Cipro for psooible urinary infection - we will check prostate test and urine culture.  Return to the clinic or go to the nearest emergency room if any of your symptoms worsen or new symptoms occur.   I personally performed the services described in this documentation, which was scribed  in my presence. The recorded information has been reviewed and considered, and addended by me as needed.

## 2014-11-17 NOTE — Telephone Encounter (Signed)
Called pt an advised pt of his scheduled MRI at [facility] tomorrow 11/18/2014.  Pt was advised to go to Radiology at 3:45 pm to check in.  Pt understood

## 2014-11-17 NOTE — Telephone Encounter (Signed)
Cervical spine results are in epic.

## 2014-11-17 NOTE — Patient Instructions (Addendum)
We will arrange for MRI of your spine in next few days. Hydrocodone if needed for pain.  Blood count is lower today - iron twice per day and recheck in 4 days with me.  Need to schedule endoscopy and colonoscopy as discussed.  You should receive a call or letter about your lab results within the next week to 10 days.  Start Cipro for psooible urinary infection - we will check prostate test and urine culture.  Return to the clinic or go to the nearest emergency room if any of your symptoms worsen or new symptoms occur.

## 2014-11-18 ENCOUNTER — Ambulatory Visit (HOSPITAL_COMMUNITY)
Admission: RE | Admit: 2014-11-18 | Discharge: 2014-11-18 | Disposition: A | Discharge status: Home / Self Care | Payer: Commercial Managed Care - PPO | Source: Ambulatory Visit | Attending: Family Medicine | Admitting: Family Medicine

## 2014-11-18 ENCOUNTER — Encounter (HOSPITAL_COMMUNITY): Payer: Self-pay | Admitting: *Deleted

## 2014-11-18 ENCOUNTER — Inpatient Hospital Stay (HOSPITAL_COMMUNITY)
Admission: EM | Admit: 2014-11-18 | Discharge: 2014-11-22 | DRG: 540 | Disposition: A | Discharge status: Home / Self Care | Payer: Commercial Managed Care - PPO | Attending: Internal Medicine | Admitting: Internal Medicine

## 2014-11-18 DIAGNOSIS — Z79899 Other long term (current) drug therapy: Secondary | ICD-10-CM | POA: Diagnosis not present

## 2014-11-18 DIAGNOSIS — M4802 Spinal stenosis, cervical region: Secondary | ICD-10-CM | POA: Insufficient documentation

## 2014-11-18 DIAGNOSIS — E440 Moderate protein-calorie malnutrition: Secondary | ICD-10-CM | POA: Diagnosis present

## 2014-11-18 DIAGNOSIS — R609 Edema, unspecified: Secondary | ICD-10-CM

## 2014-11-18 DIAGNOSIS — M4120 Other idiopathic scoliosis, site unspecified: Secondary | ICD-10-CM | POA: Diagnosis present

## 2014-11-18 DIAGNOSIS — M542 Cervicalgia: Secondary | ICD-10-CM

## 2014-11-18 DIAGNOSIS — M4642 Discitis, unspecified, cervical region: Secondary | ICD-10-CM | POA: Diagnosis not present

## 2014-11-18 DIAGNOSIS — N200 Calculus of kidney: Secondary | ICD-10-CM | POA: Diagnosis present

## 2014-11-18 DIAGNOSIS — M4622 Osteomyelitis of vertebra, cervical region: Secondary | ICD-10-CM | POA: Diagnosis present

## 2014-11-18 DIAGNOSIS — N183 Chronic kidney disease, stage 3 unspecified: Secondary | ICD-10-CM | POA: Diagnosis present

## 2014-11-18 DIAGNOSIS — Z87442 Personal history of urinary calculi: Secondary | ICD-10-CM | POA: Diagnosis not present

## 2014-11-18 DIAGNOSIS — M869 Osteomyelitis, unspecified: Secondary | ICD-10-CM | POA: Insufficient documentation

## 2014-11-18 DIAGNOSIS — B9689 Other specified bacterial agents as the cause of diseases classified elsewhere: Secondary | ICD-10-CM | POA: Diagnosis not present

## 2014-11-18 DIAGNOSIS — R937 Abnormal findings on diagnostic imaging of other parts of musculoskeletal system: Secondary | ICD-10-CM

## 2014-11-18 HISTORY — DX: Osteomyelitis of vertebra, cervical region: M46.22

## 2014-11-18 HISTORY — DX: Anemia, unspecified: D64.9

## 2014-11-18 HISTORY — DX: Unspecified protein-calorie malnutrition: E46

## 2014-11-18 LAB — COMPREHENSIVE METABOLIC PANEL
ALBUMIN: 2.9 g/dL — AB (ref 3.5–5.0)
ALK PHOS: 93 U/L (ref 38–126)
ALT: 19 U/L (ref 17–63)
ANION GAP: 10 (ref 5–15)
AST: 26 U/L (ref 15–41)
BILIRUBIN TOTAL: 0.5 mg/dL (ref 0.3–1.2)
BUN: 22 mg/dL — ABNORMAL HIGH (ref 6–20)
CO2: 26 mmol/L (ref 22–32)
CREATININE: 1.35 mg/dL — AB (ref 0.61–1.24)
Calcium: 8.7 mg/dL — ABNORMAL LOW (ref 8.9–10.3)
Chloride: 100 mmol/L — ABNORMAL LOW (ref 101–111)
GFR, EST NON AFRICAN AMERICAN: 55 mL/min — AB (ref >60)
Glucose, Bld: 97 mg/dL (ref 65–99)
POTASSIUM: 4.4 mmol/L (ref 3.5–5.1)
SODIUM: 136 mmol/L (ref 135–145)
TOTAL PROTEIN: 7.4 g/dL (ref 6.5–8.1)

## 2014-11-18 LAB — CBC WITH DIFFERENTIAL/PLATELET
Basophils Absolute: 0 10*3/uL (ref 0.0–0.1)
Basophils Relative: 1 % (ref 0–1)
EOS ABS: 0.1 10*3/uL (ref 0.0–0.7)
Eosinophils Relative: 2 % (ref 0–5)
HCT: 32.2 % — ABNORMAL LOW (ref 39.0–52.0)
Hemoglobin: 10.5 g/dL — ABNORMAL LOW (ref 13.0–17.0)
LYMPHS ABS: 1.2 10*3/uL (ref 0.7–4.0)
LYMPHS PCT: 20 % (ref 12–46)
MCH: 28.4 pg (ref 26.0–34.0)
MCHC: 32.6 g/dL (ref 30.0–36.0)
MCV: 87 fL (ref 78.0–100.0)
MONO ABS: 0.5 10*3/uL (ref 0.1–1.0)
MONOS PCT: 9 % (ref 3–12)
Neutro Abs: 4.1 10*3/uL (ref 1.7–7.7)
Neutrophils Relative %: 68 % (ref 43–77)
PLATELETS: 291 10*3/uL (ref 150–400)
RBC: 3.7 MIL/uL — ABNORMAL LOW (ref 4.22–5.81)
RDW: 13.1 % (ref 11.5–15.5)
WBC: 5.9 10*3/uL (ref 4.0–10.5)

## 2014-11-18 LAB — PSA: PSA: 0.85 ng/mL (ref <=4.00)

## 2014-11-18 MED ORDER — HYDROCODONE-ACETAMINOPHEN 5-325 MG PO TABS
1.0000 | ORAL_TABLET | Freq: Four times a day (QID) | ORAL | Status: DC | PRN
  Filled 2014-11-18: qty 1

## 2014-11-18 MED ORDER — GADOBENATE DIMEGLUMINE 529 MG/ML IV SOLN
15.0000 mL | Freq: Once | INTRAVENOUS | Status: AC | PRN
  Administered 2014-11-18: 15 mL via INTRAVENOUS

## 2014-11-18 MED ORDER — ONDANSETRON HCL 4 MG/2ML IJ SOLN: 4.0000 mg | Freq: Four times a day (QID) | INTRAMUSCULAR | Status: DC | PRN

## 2014-11-18 MED ORDER — ONDANSETRON HCL 4 MG PO TABS: 4.0000 mg | ORAL_TABLET | Freq: Four times a day (QID) | ORAL | Status: DC | PRN

## 2014-11-18 MED ORDER — ACETAMINOPHEN 325 MG PO TABS
650.0000 mg | ORAL_TABLET | Freq: Four times a day (QID) | ORAL | Status: DC | PRN
  Administered 2014-11-19: 650 mg via ORAL
  Filled 2014-11-18: qty 2

## 2014-11-18 MED ORDER — SODIUM CHLORIDE 0.9 % IJ SOLN
3.0000 mL | Freq: Two times a day (BID) | INTRAMUSCULAR | Status: DC
  Administered 2014-11-19 – 2014-11-21 (×3): 3 mL via INTRAVENOUS

## 2014-11-18 MED ORDER — TAMSULOSIN HCL 0.4 MG PO CAPS
0.4000 mg | ORAL_CAPSULE | Freq: Every day | ORAL | Status: DC
  Administered 2014-11-19 – 2014-11-22 (×4): 0.4 mg via ORAL
  Filled 2014-11-18 (×4): qty 1

## 2014-11-18 MED ORDER — SODIUM CHLORIDE 0.9 % IV SOLN
INTRAVENOUS | Status: DC
  Administered 2014-11-19 – 2014-11-21 (×6): via INTRAVENOUS

## 2014-11-18 MED ORDER — SODIUM CHLORIDE 0.9 % IV SOLN: Freq: Once | INTRAVENOUS | Status: DC

## 2014-11-18 MED ORDER — ACETAMINOPHEN 650 MG RE SUPP: 650.0000 mg | Freq: Four times a day (QID) | RECTAL | Status: DC | PRN

## 2014-11-18 NOTE — ED Provider Notes (Signed)
CSN: [id number]     Arrival date & time 11/18/14  1713 History   First MD Initiated Contact with Patient 11/18/14 1754     Chief Complaint  Patient presents with  . Neck Pain     (Consider location/radiation/quality/duration/timing/severity/associated sxs/prior Treatment) Patient is a 64 y.o. male presenting with neck pain. The history is provided by the patient.  Neck Pain Pain location:  Generalized neck Quality:  Aching, stabbing and shooting Pain radiates to:  R scapula and L shoulder Pain severity:  Moderate Pain is:  Same all the time Onset quality:  Gradual Duration:  2 weeks Timing:  Intermittent Progression:  Waxing and waning Chronicity:  New Context: not fall and not recent injury   Relieved by:  Nothing Worsened by:  Nothing tried Ineffective treatments:  None tried Associated symptoms: no bladder incontinence, no bowel incontinence, no fever, no numbness and no tingling   Risk factors: no hx of head and neck radiation and no hx of spinal trauma     Past Medical History  Diagnosis Date  . Kidney stone    Past Surgical History  Procedure Laterality Date  . Tonsillectomy    . Kidney stone surgery     Family History  Problem Relation Age of Onset  . Hypertension Mother   . Uterine cancer Mother   . Obesity Mother   . Hyperlipidemia Mother   . Heart disease Mother   . Diabetes Mother   . Cancer Mother   . Obesity Father   . Hypertension Father   . Diabetes Father   . Heart disease Father   . Hyperlipidemia Father   . Stroke Brother   . Obesity Brother   . Alzheimer's disease Brother   . Diabetes Brother   . Heart disease Brother   . Hyperlipidemia Brother   . Hypertension Brother   . Cataracts Maternal Grandmother    History  Substance Use Topics  . Smoking status: Never Smoker   . Smokeless tobacco: Not on file  . Alcohol Use: 0.5 oz/week    1 drink(s) per week    Review of Systems  Constitutional: Negative for fever.  Gastrointestinal:  Negative for bowel incontinence.  Genitourinary: Negative for bladder incontinence.  Musculoskeletal: Positive for neck pain.  Neurological: Negative for tingling and numbness.  All other systems reviewed and are negative.     Allergies  Review of patient's allergies indicates no known allergies.  Home Medications   Prior to Admission medications   Medication Sig Start Date End Date Taking? Authorizing Provider  ciprofloxacin (CIPRO) 500 MG tablet Take 1 tablet (500 mg total) by mouth 2 (two) times daily. Patient taking differently: Take 500 mg by mouth 2 (two) times daily. Started medication on 11-16-14 11/17/14  Yes [name], MD  HYDROcodone-acetaminophen (NORCO/VICODIN) 5-325 MG per tablet Take 1 tablet by mouth every 6 (six) hours as needed for moderate pain. 11/17/14  Yes [name], MD  IRON PO Take 1 tablet by mouth daily.   Yes Historical Provider, MD  tamsulosin (FLOMAX) 0.4 MG CAPS capsule Take 1 capsule (0.4 mg total) by mouth daily. 11/03/14  Yes Thao P Le, DO   BP 130/85 mmHg  Pulse 96  Temp(Src) 98 F (36.7 C) (Oral)  Resp 16  Ht 5\' 9"  (1.753 m)  Wt 129 lb 9.6 oz (58.786 kg)  BMI 19.13 kg/m2  SpO2 100% Physical Exam  Constitutional: He is oriented to person, place, and time. He appears well-developed and well-nourished. No  distress.  HENT:  Head: Normocephalic and atraumatic.  Eyes: Conjunctivae are normal.  Neck: Neck supple. No tracheal deviation present.  Cardiovascular: Normal rate and regular rhythm.   Pulmonary/Chest: Effort normal. No respiratory distress.  Abdominal: Soft. He exhibits no distension.  Musculoskeletal:       Cervical back: He exhibits tenderness (of high c-spine) and bony tenderness. He exhibits normal range of motion.  Neurological: He is alert and oriented to person, place, and time.  Skin: Skin is warm and dry.  Psychiatric: He has a normal mood and affect.    ED Course  Procedures (including critical care time) Labs  Review Labs Reviewed  CBC WITH DIFFERENTIAL/PLATELET - Abnormal; Notable for the following:    RBC 3.70 (*)    Hemoglobin 10.5 (*)    HCT 32.2 (*)    All other components within normal limits  COMPREHENSIVE METABOLIC PANEL - Abnormal; Notable for the following:    Chloride 100 (*)    BUN 22 (*)    Creatinine, Ser 1.35 (*)    Calcium 8.7 (*)    Albumin 2.9 (*)    GFR calc non Af Amer 55 (*)    All other components within normal limits  CULTURE, BLOOD (ROUTINE X 2)  CULTURE, BLOOD (ROUTINE X 2)  COMPREHENSIVE METABOLIC PANEL  CBC  PROTIME-INR  SEDIMENTATION RATE  C-REACTIVE PROTEIN    Imaging Review Dg Cervical Spine Complete  11/17/2014   CLINICAL DATA:  Neck pain for 11 days.  No known injury.  EXAM: CERVICAL SPINE  4+ VIEWS  COMPARISON:  None.  FINDINGS: Loss of normal cervical lordosis with diffuse severe multilevel degenerative change. Loss of disc space noted C4-C5 and C5-C6. Although these changes may be secondary to degenerative change erosive changes cannot be excluded and an active process including discitis cannot be excluded. MRI of the cervical spine may prove useful for further evaluation. Mild apical pleural thickening noted consistent with scarring.  IMPRESSION: 1. Loss of normal cervical lordosis with severe multilevel degenerative change.  2. Severe loss of disc space at C4-C5 and C5-C6 with possible erosive changes along the endplates. A process such as diskitis cannot be excluded and gadolinium-enhanced MRI of the cervical spine suggestive for further evaluation. These results will be called to the ordering clinician or representative by the Radiologist Assistant, and communication documented in the PACS or zVision Dashboard.   Electronically Signed   By: Marcello Moores  Register   On: 11/17/2014 11:02   Mr Cervical Spine W Wo Contrast  11/18/2014   CLINICAL DATA:  Severe neck pain extending into the upper extremities for 10 days. No fever. Abnormal x-rays.  EXAM: MRI CERVICAL  SPINE WITHOUT AND WITH CONTRAST  TECHNIQUE: Multiplanar and multiecho pulse sequences of the cervical spine, to include the craniocervical junction and cervicothoracic junction, were obtained according to standard protocol without and with intravenous contrast.  CONTRAST:  77mL MULTIHANCE GADOBENATE DIMEGLUMINE 529 MG/ML IV SOLN  COMPARISON:  Cervical spine radiographs 11/17/2014  FINDINGS: Normal signal is present in the cervical and upper thoracic spinal cord to the lowest imaged level T3-4. Abnormal marrow signal and enhancement is present at C5-6 surrounding the disc space and in the disc space. Abnormal soft tissue enhancement is present within the prevertebral space measuring up to 5 mm. This extends cephalo caudad 2.5 cm. Mild dural enhancement or venous engorgement is present at this level measuring up to 3 mm within the ventral epidural space.  There is fusion across the C4-5 disc space  and mild kyphosis at this level. Slight degenerative anterolisthesis is present at C2-3. No other pathologic enhancement is present.  Craniocervical junction is within normal limits. The visualized intracranial contents are normal. There is no pathologic enhancement.  C2-3:  Negative.  C3-4: Mild left-sided uncovertebral spurring is present without significant focal stenosis.  C4-5: Mild facet hypertrophy is noted bilaterally. There is no focal stenosis.  C5-6: Abnormal enhancement is noted in the ventral epidural space as described above. This may represent infection or venous engorgement. There is abnormal enhancement and signal in the disc and adjacent endplates compatible with discitis osteomyelitis.  C6-7: A rightward disc osteophyte complex results in mild right foraminal stenosis. The central canal and left foramen are patent.  C7-T1:  Negative  IMPRESSION: 1. 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 2. Up to 5 mm of pathologic  prevertebral soft tissue swelling consistent with infection/inflammation. 3. 3 mm of ventral dural soft tissue enhancement compatible with infection or venous engorgement. 4. Mild right foraminal narrowing at C6-7. These results were called by telephone at the time of interpretation on 11/18/2014 at 5:07 pm to Dr. [name] , who verbally acknowledged these results.   Electronically Signed   By: San Morelle M.D.   On: 11/18/2014 17:07     EKG Interpretation None      MDM   Final diagnoses:  Osteomyelitis of cervical spine   64 year old male presents with recent MRI results concerning for discitis. He is hemodynamically stable, does not appear septic, has not had any fevers, and is otherwise well-appearing. He only has a complaint of neck pain.  Screening labs are not concerning for signs of sepsis, the patient will require biopsy and antibiotic management of his discitis and likely long-term IV antibiotics through PICC line so the hospitalist was consulted for admission and management of this patient.    Leo Grosser, MD 11/19/14 3300  Varney Biles, MD 11/19/14 0157

## 2014-11-18 NOTE — H&P (Signed)
[facility] History and Physical  Patient: Jacob Logan  MRN: [id number]  DOB: 08/09/1950  DOS: the patient was seen and examined on 11/18/2014 PCP: LE, Cipriano Mile, DO  Chief Complaint: Neck pain  HPI: Jacob Logan is a 64 y.o. male with Past medical history of renal stones. The patient presents with complaints of neck pain. The pain has been ongoing for last few weeks and progressively worsening and does not improve. The patient had history of renal stone in the past and had another episode of renal stone this time and was started on ciprofloxacin 2 days ago by the PCP. Due to his recurrent neck pain at x-ray of the cervical spine was obtained. The spine x-ray was inconclusive and therefore an MRI was obtained which was showing discitis with signs of osteomyelitis. Therefore the patient was brought to the hospital. Next and the patient denies any complains of tingling or paresthesia or focal weakness. He denies any loss of control of bowel or bladder. He denies any prior motor vehicle injury prior or trauma or injury. He denies any changes in his medication.  The patient is coming from home. And at his baseline independent for most of his ADL.  Review of Systems: as mentioned in the history of present illness.  A comprehensive review of the other systems is negative.  Past Medical History  Diagnosis Date  . Kidney stone    Past Surgical History  Procedure Laterality Date  . Tonsillectomy    . Kidney stone surgery     Social History:  reports that he has never smoked. He does not have any smokeless tobacco history on file. He reports that he drinks about 0.5 oz of alcohol per week. He reports that he does not use illicit drugs.  No Known Allergies  Family History  Problem Relation Age of Onset  . Hypertension Mother   . Uterine cancer Mother   . Obesity Mother   . Hyperlipidemia Mother   . Heart disease Mother   . Diabetes Mother   . Cancer Mother   . Obesity Father    . Hypertension Father   . Diabetes Father   . Heart disease Father   . Hyperlipidemia Father   . Stroke Brother   . Obesity Brother   . Alzheimer's disease Brother   . Diabetes Brother   . Heart disease Brother   . Hyperlipidemia Brother   . Hypertension Brother   . Cataracts Maternal Grandmother     Prior to Admission medications   Medication Sig Start Date End Date Taking? Authorizing Provider  ciprofloxacin (CIPRO) 500 MG tablet Take 1 tablet (500 mg total) by mouth 2 (two) times daily. Patient taking differently: Take 500 mg by mouth 2 (two) times daily. Started medication on 11-16-14 11/17/14  Yes [name], MD  HYDROcodone-acetaminophen (NORCO/VICODIN) 5-325 MG per tablet Take 1 tablet by mouth every 6 (six) hours as needed for moderate pain. 11/17/14  Yes [name], MD  IRON PO Take 1 tablet by mouth daily.   Yes Historical Provider, MD  tamsulosin (FLOMAX) 0.4 MG CAPS capsule Take 1 capsule (0.4 mg total) by mouth daily. 11/03/14  Yes Thao Susanne Borders, DO    Physical Exam: Filed Vitals:   11/18/14 2115 11/18/14 2145 11/18/14 2226 11/18/14 2230  BP: 122/74 125/78 132/84 124/77  Pulse: 60 59 65 67  Temp:      TempSrc:      Resp: _0 SpO2: 98% 98% 96%  98%    General: Alert, Awake and Oriented to Time, Place and Person. Appear in mild distress Eyes: PERRL ENT: Oral Mucosa clear moist. Neck: no JVD Cardiovascular: S1 and S2 Present, no Murmur, Peripheral Pulses Present Respiratory: Bilateral Air entry equal and Decreased,  Clear to Auscultation, no Crackles, no wheezes Abdomen: Bowel Sound present, Soft and non tender Skin: no Rash Extremities: no Pedal edema, no calf tenderness Neurologic: Grossly no focal neuro deficit.  Labs on Admission:  CBC:  Recent Labs Lab 11/17/14 0956 11/18/14 1824  WBC 7.9 5.9  NEUTROABS  --  4.1  HGB 10.3* 10.5*  HCT 32.2* 32.2*  MCV 85.6 87.0  PLT  --  291    CMP     Component Value Date/Time   NA 136  11/18/2014 1824   K 4.4 11/18/2014 1824   CL 100* 11/18/2014 1824   CO2 26 11/18/2014 1824   GLUCOSE 97 11/18/2014 1824   BUN 22* 11/18/2014 1824   CREATININE 1.35* 11/18/2014 1824   CREATININE 1.24 11/17/2014 0948   CALCIUM 8.7* 11/18/2014 1824   PROT 7.4 11/18/2014 1824   ALBUMIN 2.9* 11/18/2014 1824   AST 26 11/18/2014 1824   ALT 19 11/18/2014 1824   ALKPHOS 93 11/18/2014 1824   BILITOT 0.5 11/18/2014 1824   GFRNONAA 55* 11/18/2014 1824   GFRNONAA 50* 11/03/2014 0953   GFRAA >60 11/18/2014 1824   GFRAA 57* 11/03/2014 0953    No results for input(s): LIPASE, AMYLASE in the last 168 hours.  No results for input(s): CKTOTAL, CKMB, CKMBINDEX, TROPONINI in the last 168 hours. BNP (last 3 results) No results for input(s): BNP in the last 8760 hours.  ProBNP (last 3 results) No results for input(s): PROBNP in the last 8760 hours.   Radiological Exams on Admission: Dg Cervical Spine Complete  11/17/2014   CLINICAL DATA:  Neck pain for 11 days.  No known injury.  EXAM: CERVICAL SPINE  4+ VIEWS  COMPARISON:  None.  FINDINGS: Loss of normal cervical lordosis with diffuse severe multilevel degenerative change. Loss of disc space noted C4-C5 and C5-C6. Although these changes may be secondary to degenerative change erosive changes cannot be excluded and an active process including discitis cannot be excluded. MRI of the cervical spine may prove useful for further evaluation. Mild apical pleural thickening noted consistent with scarring.  IMPRESSION: 1. Loss of normal cervical lordosis with severe multilevel degenerative change.  2. Severe loss of disc space at C4-C5 and C5-C6 with possible erosive changes along the endplates. A process such as diskitis cannot be excluded and gadolinium-enhanced MRI of the cervical spine suggestive for further evaluation. These results will be called to the ordering clinician or representative by the Radiologist Assistant, and communication documented in the PACS  or zVision Dashboard.   Electronically Signed   By: Marcello Moores  Register   On: 11/17/2014 11:02   Mr Cervical Spine W Wo Contrast  11/18/2014   CLINICAL DATA:  Severe neck pain extending into the upper extremities for 10 days. No fever. Abnormal x-rays.  EXAM: MRI CERVICAL SPINE WITHOUT AND WITH CONTRAST  TECHNIQUE: Multiplanar and multiecho pulse sequences of the cervical spine, to include the craniocervical junction and cervicothoracic junction, were obtained according to standard protocol without and with intravenous contrast.  CONTRAST:  74m MULTIHANCE GADOBENATE DIMEGLUMINE 529 MG/ML IV SOLN  COMPARISON:  Cervical spine radiographs 11/17/2014  FINDINGS: Normal signal is present in the cervical and upper thoracic spinal cord to the lowest imaged level T3-4. Abnormal  marrow signal and enhancement is present at C5-6 surrounding the disc space and in the disc space. Abnormal soft tissue enhancement is present within the prevertebral space measuring up to 5 mm. This extends cephalo caudad 2.5 cm. Mild dural enhancement or venous engorgement is present at this level measuring up to 3 mm within the ventral epidural space.  There is fusion across the C4-5 disc space and mild kyphosis at this level. Slight degenerative anterolisthesis is present at C2-3. No other pathologic enhancement is present.  Craniocervical junction is within normal limits. The visualized intracranial contents are normal. There is no pathologic enhancement.  C2-3:  Negative.  C3-4: Mild left-sided uncovertebral spurring is present without significant focal stenosis.  C4-5: Mild facet hypertrophy is noted bilaterally. There is no focal stenosis.  C5-6: Abnormal enhancement is noted in the ventral epidural space as described above. This may represent infection or venous engorgement. There is abnormal enhancement and signal in the disc and adjacent endplates compatible with discitis osteomyelitis.  C6-7: A rightward disc osteophyte complex results in  mild right foraminal stenosis. The central canal and left foramen are patent.  C7-T1:  Negative  IMPRESSION: 1. 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 2. Up to 5 mm of pathologic prevertebral soft tissue swelling consistent with infection/inflammation. 3. 3 mm of ventral dural soft tissue enhancement compatible with infection or venous engorgement. 4. Mild right foraminal narrowing at C6-7. These results were called by telephone at the time of interpretation on 11/18/2014 at 5:07 pm to Dr. [name] , who verbally acknowledged these results.   Electronically Signed   By: San Morelle M.D.   On: 11/18/2014 17:07    Assessment/Plan Principal Problem:   Osteomyelitis of cervical spine Active Problems:   Scoliosis (and kyphoscoliosis), idiopathic   Renal stone   1. Osteomyelitis of cervical spine The patient is presenting with complaints of neck pain. MRI is positive for osteomyelitis with discitis. Patient does not have any focal deficit at present. Next and does not have any spinal cord involvement based on the MRI. With this I have discussed the case briefly with neurosurgery over the phone who recommends to place patient on soft cervical collar as well as consulted interventional radiology for possible biopsy in the morning and hold off on antibiotics until then. Therefore I will be checking ESR CRP intervention radiology was consulted. Patient remains nothing by mouth after midnight. Pain management as needed.  2. History of renal stone. Patient mentions he has passed a renal stone. I will continue Flomax. Would hold off on ciprofloxacin. Gentle IV hydration.  Advance goals of care discussion: Full code   DVT Prophylaxis: mechanical compression device  Nutrition: Nothing by mouth after midnight  Family Communication: family was present at bedside, opportunity was given to ask  question and all questions were answered satisfactorily at the time of interview. Disposition: Admitted as inpatient, telemetry unit.  Author: Berle Mull, MD [facility] Pager: [phone] 11/18/2014  If 7PM-7AM, please contact night-coverage [email or web address] Password [email or web address]

## 2014-11-18 NOTE — ED Notes (Signed)
Pt sent over from MRI this afternoon, told not to go home due to infection seen around discs cervical spine. Has had neck pain/shoulder pain x 12 days. No fevers.

## 2014-11-18 NOTE — ED Notes (Signed)
MD at bedside. 

## 2014-11-19 ENCOUNTER — Encounter (HOSPITAL_COMMUNITY): Payer: Self-pay | Admitting: General Practice

## 2014-11-19 DIAGNOSIS — M4642 Discitis, unspecified, cervical region: Secondary | ICD-10-CM

## 2014-11-19 DIAGNOSIS — M4120 Other idiopathic scoliosis, site unspecified: Secondary | ICD-10-CM

## 2014-11-19 DIAGNOSIS — M4622 Osteomyelitis of vertebra, cervical region: Principal | ICD-10-CM

## 2014-11-19 DIAGNOSIS — N200 Calculus of kidney: Secondary | ICD-10-CM

## 2014-11-19 DIAGNOSIS — B9689 Other specified bacterial agents as the cause of diseases classified elsewhere: Secondary | ICD-10-CM

## 2014-11-19 DIAGNOSIS — E440 Moderate protein-calorie malnutrition: Secondary | ICD-10-CM

## 2014-11-19 LAB — COMPREHENSIVE METABOLIC PANEL
ALT: 20 U/L (ref 17–63)
ANION GAP: 8 (ref 5–15)
AST: 23 U/L (ref 15–41)
Albumin: 2.6 g/dL — ABNORMAL LOW (ref 3.5–5.0)
Alkaline Phosphatase: 78 U/L (ref 38–126)
BUN: 16 mg/dL (ref 6–20)
CO2: 27 mmol/L (ref 22–32)
Calcium: 8.4 mg/dL — ABNORMAL LOW (ref 8.9–10.3)
Chloride: 103 mmol/L (ref 101–111)
Creatinine, Ser: 1.27 mg/dL — ABNORMAL HIGH (ref 0.61–1.24)
GFR, EST NON AFRICAN AMERICAN: 59 mL/min — AB (ref >60)
GLUCOSE: 88 mg/dL (ref 65–99)
Potassium: 4 mmol/L (ref 3.5–5.1)
Sodium: 138 mmol/L (ref 135–145)
TOTAL PROTEIN: 6.5 g/dL (ref 6.5–8.1)
Total Bilirubin: 0.6 mg/dL (ref 0.3–1.2)

## 2014-11-19 LAB — CBC
HCT: 30 % — ABNORMAL LOW (ref 39.0–52.0)
Hemoglobin: 9.8 g/dL — ABNORMAL LOW (ref 13.0–17.0)
MCH: 28.2 pg (ref 26.0–34.0)
MCHC: 32.7 g/dL (ref 30.0–36.0)
MCV: 86.5 fL (ref 78.0–100.0)
Platelets: 280 10*3/uL (ref 150–400)
RBC: 3.47 MIL/uL — AB (ref 4.22–5.81)
RDW: 13.1 % (ref 11.5–15.5)
WBC: 4.5 10*3/uL (ref 4.0–10.5)

## 2014-11-19 LAB — PROTIME-INR
INR: 1.17 (ref 0.00–1.49)
Prothrombin Time: 15.1 seconds (ref 11.6–15.2)

## 2014-11-19 LAB — URINE CULTURE
COLONY COUNT: NO GROWTH
Organism ID, Bacteria: NO GROWTH

## 2014-11-19 LAB — C-REACTIVE PROTEIN: CRP: 3.6 mg/dL — ABNORMAL HIGH (ref <1.0)

## 2014-11-19 LAB — SEDIMENTATION RATE: SED RATE: 80 mm/h — AB (ref 0–16)

## 2014-11-19 MED ORDER — CEFTRIAXONE SODIUM IN DEXTROSE 40 MG/ML IV SOLN
2.0000 g | INTRAVENOUS | Status: DC
  Administered 2014-11-19 – 2014-11-21 (×3): 2 g via INTRAVENOUS
  Filled 2014-11-19 (×4): qty 50

## 2014-11-19 MED ORDER — ADULT MULTIVITAMIN W/MINERALS CH
1.0000 | ORAL_TABLET | Freq: Every day | ORAL | Status: DC
  Administered 2014-11-19 – 2014-11-22 (×4): 1 via ORAL
  Filled 2014-11-19 (×5): qty 1

## 2014-11-19 MED ORDER — VANCOMYCIN HCL 500 MG IV SOLR
500.0000 mg | Freq: Two times a day (BID) | INTRAVENOUS | Status: DC
  Administered 2014-11-20 – 2014-11-21 (×4): 500 mg via INTRAVENOUS
  Filled 2014-11-19 (×6): qty 500

## 2014-11-19 MED ORDER — VANCOMYCIN HCL IN DEXTROSE 1-5 GM/200ML-% IV SOLN
1000.0000 mg | Freq: Once | INTRAVENOUS | Status: AC
  Administered 2014-11-19: 1000 mg via INTRAVENOUS
  Filled 2014-11-19: qty 200

## 2014-11-19 MED ORDER — ENSURE ENLIVE PO LIQD
237.0000 mL | ORAL | Status: DC
  Administered 2014-11-19 – 2014-11-21 (×3): 237 mL via ORAL

## 2014-11-19 NOTE — Progress Notes (Signed)
[facility] PROGRESS NOTE   Jacob Logan GTX:[id number] DOB: [birth year] DOA: 11/18/2014 PCP: Rikki Spearing PHUONG, DO  HPI/Subjective: Denies any significant pain, no fever or chills.  Assessment/Plan: Principal Problem:   Osteomyelitis of cervical spine Active Problems:   Scoliosis (and kyphoscoliosis), idiopathic   Renal stone   Malnutrition of moderate degree    Osteomyelitis and discitis at C5-C6 level Patient presented to the hospital with neck pain, elevated CRP and ESR. MR of the apical spine showed osteomyelitis/discitis at C5-C6 levels. Neurosurgery recommended IR biopsy, discussed with IR who recommended empiric treatment rather than getting biopsy. Patient was recently on ciprofloxacin for renal stone, not on antibiotics yet. ID consulted for further management.  Recent renal stone Patient thinks he passed it, no leukocytosis and no pain.  Malnutrition of moderate degree Per dietitian.   Code Status: Full Code Family Communication: Plan discussed with the patient. Disposition Plan: Remains inpatient Diet: Diet NPO time specified Except for: Sips with Meds, Ice Chips  Consultants:  ID  Procedures:  None  Antibiotics:  None   Objective: Filed Vitals:   11/19/14 0602  BP: 112/80  Pulse: 62  Temp: 98.3 F (36.8 C)  Resp: 14    Intake/Output Summary (Last 24 hours) at 11/19/14 1238 Last data filed at 11/19/14 1153  Gross per 24 hour  Intake 598.75 ml  Output   2465 ml  Net -1866.25 ml   Filed Weights   11/18/14 2331 11/19/14 0602  Weight: 58.786 kg (129 lb 9.6 oz) 58.196 kg (128 lb 4.8 oz)    Exam: General: Alert and awake, oriented x3, not in any acute distress. HEENT: anicteric sclera, pupils reactive to light and accommodation, EOMI CVS: S1-S2 clear, no murmur rubs or gallops Chest: clear to auscultation bilaterally, no wheezing, rales or rhonchi Abdomen: soft nontender, nondistended, normal bowel sounds, no  organomegaly Extremities: no cyanosis, clubbing or edema noted bilaterally Neuro: Cranial nerves II-XII intact, no focal neurological deficits  Data Reviewed: Basic Metabolic Panel:  Recent Labs Lab 11/17/14 0948 11/18/14 1824 11/19/14 0440  NA 137 136 138  K 4.5 4.4 4.0  CL 101 100* 103  CO2 $Re'27 26 27  'NWg$ GLUCOSE 93 97 88  BUN 19 22* 16  CREATININE 1.24 1.35* 1.27*  CALCIUM 8.8 8.7* 8.4*   Liver Function Tests:  Recent Labs Lab 11/18/14 1824 11/19/14 0440  AST 26 23  ALT 19 20  ALKPHOS 93 78  BILITOT 0.5 0.6  PROT 7.4 6.5  ALBUMIN 2.9* 2.6*   No results for input(s): LIPASE, AMYLASE in the last 168 hours. No results for input(s): AMMONIA in the last 168 hours. CBC:  Recent Labs Lab 11/17/14 0956 11/18/14 1824 11/19/14 0440  WBC 7.9 5.9 4.5  NEUTROABS  --  4.1  --   HGB 10.3* 10.5* 9.8*  HCT 32.2* 32.2* 30.0*  MCV 85.6 87.0 86.5  PLT  --  291 280   Cardiac Enzymes: No results for input(s): CKTOTAL, CKMB, CKMBINDEX, TROPONINI in the last 168 hours. BNP (last 3 results) No results for input(s): BNP in the last 8760 hours.  ProBNP (last 3 results) No results for input(s): PROBNP in the last 8760 hours.  CBG: No results for input(s): GLUCAP in the last 168 hours.  Micro Recent Results (from the past 240 hour(s))  Urine culture     Status: None   Collection Time: 11/17/14  9:52 AM  Result Value Ref Range Status   Colony Count NO GROWTH  Final   Organism ID,  Bacteria NO GROWTH  Final     Studies: Mr Cervical Spine W Wo Contrast  11/18/2014   CLINICAL DATA:  Severe neck pain extending into the upper extremities for 10 days. No fever. Abnormal x-rays.  EXAM: MRI CERVICAL SPINE WITHOUT AND WITH CONTRAST  TECHNIQUE: Multiplanar and multiecho pulse sequences of the cervical spine, to include the craniocervical junction and cervicothoracic junction, were obtained according to standard protocol without and with intravenous contrast.  CONTRAST:  49mL MULTIHANCE  GADOBENATE DIMEGLUMINE 529 MG/ML IV SOLN  COMPARISON:  Cervical spine radiographs 11/17/2014  FINDINGS: Normal signal is present in the cervical and upper thoracic spinal cord to the lowest imaged level T3-4. Abnormal marrow signal and enhancement is present at C5-6 surrounding the disc space and in the disc space. Abnormal soft tissue enhancement is present within the prevertebral space measuring up to 5 mm. This extends cephalo caudad 2.5 cm. Mild dural enhancement or venous engorgement is present at this level measuring up to 3 mm within the ventral epidural space.  There is fusion across the C4-5 disc space and mild kyphosis at this level. Slight degenerative anterolisthesis is present at C2-3. No other pathologic enhancement is present.  Craniocervical junction is within normal limits. The visualized intracranial contents are normal. There is no pathologic enhancement.  C2-3:  Negative.  C3-4: Mild left-sided uncovertebral spurring is present without significant focal stenosis.  C4-5: Mild facet hypertrophy is noted bilaterally. There is no focal stenosis.  C5-6: Abnormal enhancement is noted in the ventral epidural space as described above. This may represent infection or venous engorgement. There is abnormal enhancement and signal in the disc and adjacent endplates compatible with discitis osteomyelitis.  C6-7: A rightward disc osteophyte complex results in mild right foraminal stenosis. The central canal and left foramen are patent.  C7-T1:  Negative  IMPRESSION: 1. 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 2. Up to 5 mm of pathologic prevertebral soft tissue swelling consistent with infection/inflammation. 3. 3 mm of ventral dural soft tissue enhancement compatible with infection or venous engorgement. 4. Mild right foraminal narrowing at C6-7. These results were called by telephone at the time of interpretation on  11/18/2014 at 5:07 pm to Dr. [name] , who verbally acknowledged these results.   Electronically Signed   By: San Morelle M.D.   On: 11/18/2014 17:07    Scheduled Meds: . sodium chloride   Intravenous Once  . sodium chloride  3 mL Intravenous Q12H  . tamsulosin  0.4 mg Oral Daily   Continuous Infusions: . sodium chloride 75 mL/hr at 11/19/14 0001       Time spent: 35 minutes    [facility]  [facility] Pager [phone] If 7PM-7AM, please contact night-coverage at [email or web address], password [facility] 11/19/2014, 12:38 PM  LOS: 1 day

## 2014-11-19 NOTE — Progress Notes (Signed)
Nutrition Follow-up  DOCUMENTATION CODES:  Non-severe (moderate) malnutrition in context of social or environmental circumstances  INTERVENTION:  Ensure Enlive (each supplement provides 350kcal and 20 grams of protein), MVI  NUTRITION DIAGNOSIS:  Malnutrition related to social / environmental circumstances as evidenced by moderate depletions of muscle mass, mild depletion of body fat, energy intake < 75% for > or equal to 3 months.   GOAL:  Patient will meet greater than or equal to 90% of their needs   MONITOR:  Diet advancement, Supplement acceptance, PO intake, Labs, Weight trends, Skin  REASON FOR ASSESSMENT:  Malnutrition Screening Tool    ASSESSMENT: 64 y.o. male with Past medical history of renal stones. The patient presents with complaints of neck pain, ongoing for last few weeks and progressively worsening. An MRI was obtained which was showing discitis with signs of osteomyelitis.  Pt states that his appetite has been normal and he feels he has been eating well. He usually eats one meal (per family at bedside this is not much), a slice of pie, and some peanut butter crackers daily. He states that he works at night so he eats one meal before and 2 snacks during work. He reports weighing 136 lbs last week and doctor's office. Possible that current weight is not accurate. Pt has moderate depletion of muscle mass and mild depletion of fat mass. Estimate that pt is only consuming 50-60% of estimated calorie needs. RD emphasized the importance of getting adequate nutrition and maintaining a healthy weight. Pt is agreeable to trying Ensure when diet is advanced.   Labs: low calcium, low hemoglobin, elevated CRP  Height:  Ht Readings from Last 1 Encounters:  11/18/14 5\' 9"  (1.753 m)    Weight:  Wt Readings from Last 1 Encounters:  11/19/14 128 lb 4.8 oz (58.196 kg)    Ideal Body Weight:  72.7 kg  Wt Readings from Last 10 Encounters:  11/19/14 128 lb 4.8 oz  (58.196 kg)  11/17/14 136 lb (61.689 kg)  11/03/14 137 lb (62.143 kg)  10/19/14 139 lb 3.2 oz (63.141 kg)  10/18/14 138 lb (62.596 kg)  03/07/14 135 lb 3.2 oz (61.326 kg)  05/18/13 141 lb 12.8 oz (64.32 kg)  09/06/12 138 lb (62.596 kg)    BMI:  Body mass index is 18.94 kg/(m^2).  Estimated Nutritional Needs:  Kcal:  1800-2000  Protein:  70-85 grams  Fluid:  1.8 L/day  Skin:  Reviewed, no issues  Diet Order:  Diet NPO time specified Except for: Sips with Meds, Ice Chips  EDUCATION NEEDS:  No education needs identified at this time   Intake/Output Summary (Last 24 hours) at 11/19/14 1124 Last data filed at 11/19/14 0956  Gross per 24 hour  Intake 598.75 ml  Output   2115 ml  Net -1516.25 ml    Last BM:  6/7  Pryor Ochoa RD, LDN Inpatient Clinical Dietitian Pager: [phone] After Hours Pager: [phone]

## 2014-11-19 NOTE — Progress Notes (Signed)
Cervical soft collar placed by PM nurse.

## 2014-11-19 NOTE — Progress Notes (Signed)
ANTIBIOTIC CONSULT NOTE - INITIAL  Pharmacy Consult:  Vancomycin Indication:  Osteomyelitis  No Known Allergies  Patient Measurements: Height: 5\' 9"  (175.3 cm) Weight: 128 lb 4.8 oz (58.196 kg) (scale A) IBW/kg (Calculated) : 70.7  Vital Signs: Temp: 97.8 F (36.6 C) (06/09 2057) Temp Source: Oral (06/09 2057) BP: 111/62 mmHg (06/09 2057) Pulse Rate: 92 (06/09 2057) Intake/Output from previous day: 06/08 0701 - 06/09 0700 In: 568.8 [P.O.:120; I.V.:448.8] Out: 1365 [Urine:1365] Intake/Output from this shift: Total I/O In: -  Out: 300 [Urine:300]  Labs:  Recent Labs  11/17/14 0948 11/17/14 0956 11/18/14 1824 11/19/14 0440  WBC  --  7.9 5.9 4.5  HGB  --  10.3* 10.5* 9.8*  PLT  --   --  291 280  CREATININE 1.24  --  1.35* 1.27*   Estimated Creatinine Clearance: 49.6 mL/min (by C-G formula based on Cr of 1.27). No results for input(s): VANCOTROUGH, VANCOPEAK, VANCORANDOM, GENTTROUGH, GENTPEAK, GENTRANDOM, TOBRATROUGH, TOBRAPEAK, TOBRARND, AMIKACINPEAK, AMIKACINTROU, AMIKACIN in the last 72 hours.   Microbiology: Recent Results (from the past 720 hour(s))  Urine culture     Status: None   Collection Time: 11/17/14  9:52 AM  Result Value Ref Range Status   Colony Count NO GROWTH  Final   Organism ID, Bacteria NO GROWTH  Final    Medical History: Past Medical History  Diagnosis Date  . Kidney stone   . Anemia   . Osteomyelitis of cervical spine 11/2014  . Malnourished       Assessment: 63 YOM presented with complaint of progressive neck pain for 2-3 weeks.  MRI showed discitis osteomyelitis at the C5-6 level and Pharmacy consulted to initiate vancomycin.  Patient is also started on Rocephin.  Baseline labs reviewed.   Goal of Therapy:  Vancomycin trough level 15-20 mcg/ml   Plan:  - Vanc 1gm IV x 1, then 500mg  IV Q12H - Rocephin 2gm IV Q24H per MD - Monitor renal fxn, clinical progress, vanc trough at Css    Neyah Ellerman D. Mina Marble, PharmD, BCPS Pager:  [phone] 11/19/2014, 9:03 PM

## 2014-11-19 NOTE — Progress Notes (Signed)
Orthopedic Tech Progress Note Patient Details:  Ell Tiso 1950-11-17 [id number]  Ortho Devices Type of Ortho Device: Soft collar Ortho Device/Splint Interventions: Application   Cammer, Theodoro Parma 11/19/2014, 12:47 AM

## 2014-11-19 NOTE — Consult Note (Signed)
[facility] for Infectious Disease  Total days of antibiotics 0               Reason for Consult: cervical spine osteo    Referring Physician: elmahi  Principal Problem:   Osteomyelitis of cervical spine Active Problems:   Scoliosis (and kyphoscoliosis), idiopathic   Renal stone   Malnutrition of moderate degree    HPI: Jacob Logan is a 64 y.o. male with hx of scoliosis, hx of nephrolithiasis who was admitted on 6/8 for progress neck pain x 2-3 weeks. HEThe pain has been ongoing for last few weeks that has progressively worsened. He felt tightness across both shoulders, occasionally very sharp pain when turning his neck. Deep aching, not necessarily pain on spinous process. He had  MRI wthat showed Abnormal enhancement at C5-C6 in the ventral epidural space as described above. This may represent infection or venous engorgement as well as abnormal enhancement and signal in the disc and adjacent endplates compatible with discitis osteomyelitis. the patient denies any complains of tingling or paresthesia or focal weakness. He denies any loss of control of bowel or bladder. He denies any prior trauma or injury. He was admitted for management. He has been afebrile, no leukocytosis. He has elevated inflammatory markers of sed rate 80, and crp 3.6. He reports that he recently took 2 doses of cipro just prior to coming into the hospital which was prescribe to treat passing kidney stone.  Past Medical History  Diagnosis Date  . Kidney stone   . Anemia   . Osteomyelitis of cervical spine 11/2014  . Malnourished     Allergies: No Known Allergies  MEDICATIONS: . feeding supplement (ENSURE ENLIVE)  237 mL Oral Q24H  . multivitamin with minerals  1 tablet Oral Daily  . sodium chloride  3 mL Intravenous Q12H  . tamsulosin  0.4 mg Oral Daily    History  Substance Use Topics  . Smoking status: Never Smoker   . Smokeless tobacco: Never Used  . Alcohol Use: 0.5 oz/week    1 Standard drinks  or equivalent per week  - he works 2 jobs. 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 Hypertension Mother   . Uterine cancer Mother   . Obesity Mother   . Hyperlipidemia Mother   . Heart disease Mother   . Diabetes Mother   . Cancer Mother   . Obesity Father   . Hypertension Father   . Diabetes Father   . Heart disease Father   . Hyperlipidemia Father   . Stroke Brother   . Obesity Brother   . Alzheimer's disease Brother   . Diabetes Brother   . Heart disease Brother   . Hyperlipidemia Brother   . Hypertension Brother   . Cataracts Maternal Grandmother      Review of Systems  Constitutional: Negative for fever, chills, diaphoresis, activity change, appetite change, fatigue and unexpected weight change.  HENT: Negative for congestion, sore throat, rhinorrhea, sneezing, trouble swallowing and sinus pressure.  Eyes: Negative for photophobia and visual disturbance.  Respiratory: Negative for cough, chest tightness, shortness of breath, wheezing and stridor.  Cardiovascular: Negative for chest pain, palpitations and leg swelling.  Gastrointestinal: Negative for nausea, vomiting, abdominal pain, diarrhea, constipation, blood in stool, abdominal distention and anal bleeding.  Genitourinary: Negative for dysuria, hematuria, flank pain and difficulty  urinating.  Musculoskeletal: +neck pain. Negative for myalgias, back pain, joint swelling, arthralgias and gait problem.  Skin: Negative for color change, pallor, rash and wound.  Neurological: Negative for dizziness, tremors, weakness and light-headedness.  Hematological: Negative for adenopathy. Does not bruise/bleed easily.  Psychiatric/Behavioral: Negative for behavioral problems, confusion, sleep disturbance, dysphoric mood, decreased concentration and agitation.     OBJECTIVE: Temp:  [98 F (36.7  C)-98.6 F (37 C)] 98.6 F (37 C) (06/09 1444) Pulse Rate:  [57-96] 75 (06/09 1444) Resp:  [11-19] 16 (06/09 1444) BP: (112-137)/(65-85) 125/77 mmHg (06/09 1444) SpO2:  [96 %-100 %] 97 % (06/09 1444) Weight:  [128 lb 4.8 oz (58.196 kg)-129 lb 9.6 oz (58.786 kg)] 128 lb 4.8 oz (58.196 kg) (06/09 0602) Physical Exam  Constitutional: He is oriented to person, place, and time. He appears well-developed and well-nourished. No distress.  HENT: wearing soft cervical collar. Mouth/Throat: Oropharynx is clear and moist. No oropharyngeal exudate.  Cardiovascular: Normal rate, regular rhythm and normal heart sounds. Exam reveals no gallop and no friction rub.  No murmur heard.  Pulmonary/Chest: Effort normal and breath sounds normal. No respiratory distress. He has no wheezes.  Abdominal: Soft. Bowel sounds are normal. He exhibits no distension. There is no tenderness.  Lymphadenopathy:  He has no cervical adenopathy.  Neurological: He is alert and oriented to person, place, and time. CN2-12 intact. 5/5 motor grip strength. Skin: Skin is warm and dry. No rash noted. No erythema.  Psychiatric: He has a normal mood and affect. His behavior is normal.    LABS: Results for orders placed or performed during the hospital encounter of 11/18/14 (from the past 48 hour(s))  CBC with Differential     Status: Abnormal   Collection Time: 11/18/14  6:24 PM  Result Value Ref Range   WBC 5.9 4.0 - 10.5 K/uL   RBC 3.70 (L) 4.22 - 5.81 MIL/uL   Hemoglobin 10.5 (L) 13.0 - 17.0 g/dL   HCT 32.2 (L) 39.0 - 52.0 %   MCV 87.0 78.0 - 100.0 fL   MCH 28.4 26.0 - 34.0 pg   MCHC 32.6 30.0 - 36.0 g/dL   RDW 13.1 11.5 - 15.5 %   Platelets 291 150 - 400 K/uL   Neutrophils Relative % 68 43 - 77 %   Neutro Abs 4.1 1.7 - 7.7 K/uL   Lymphocytes Relative 20 12 - 46 %   Lymphs Abs 1.2 0.7 - 4.0 K/uL   Monocytes Relative 9 3 - 12 %   Monocytes Absolute 0.5 0.1 - 1.0 K/uL   Eosinophils Relative 2 0 - 5 %   Eosinophils  Absolute 0.1 0.0 - 0.7 K/uL   Basophils Relative 1 0 - 1 %   Basophils Absolute 0.0 0.0 - 0.1 K/uL  Comprehensive metabolic panel     Status: Abnormal   Collection Time: 11/18/14  6:24 PM  Result Value Ref Range   Sodium 136 135 - 145 mmol/L   Potassium 4.4 3.5 - 5.1 mmol/L   Chloride 100 (L) 101 - 111 mmol/L   CO2 26 22 - 32 mmol/L   Glucose, Bld 97 65 - 99 mg/dL   BUN 22 (H) 6 - 20 mg/dL   Creatinine, Ser 1.35 (H) 0.61 - 1.24 mg/dL   Calcium 8.7 (L) 8.9 - 10.3 mg/dL   Total Protein 7.4 6.5 - 8.1 g/dL   Albumin 2.9 (L) 3.5 - 5.0 g/dL   AST 26 15 - 41 U/L   ALT 19 17 -  63 U/L   Alkaline Phosphatase 93 38 - 126 U/L   Total Bilirubin 0.5 0.3 - 1.2 mg/dL   GFR calc non Af Amer 55 (L) >60 mL/min   GFR calc Af Amer >60 >60 mL/min    Comment: (NOTE) The eGFR has been calculated using the CKD EPI equation. This calculation has not been validated in all clinical situations. eGFR's persistently <60 mL/min signify possible Chronic Kidney Disease.    Anion gap 10 5 - 15  Comprehensive metabolic panel     Status: Abnormal   Collection Time: 11/19/14  4:40 AM  Result Value Ref Range   Sodium 138 135 - 145 mmol/L   Potassium 4.0 3.5 - 5.1 mmol/L   Chloride 103 101 - 111 mmol/L   CO2 27 22 - 32 mmol/L   Glucose, Bld 88 65 - 99 mg/dL   BUN 16 6 - 20 mg/dL   Creatinine, Ser 1.27 (H) 0.61 - 1.24 mg/dL   Calcium 8.4 (L) 8.9 - 10.3 mg/dL   Total Protein 6.5 6.5 - 8.1 g/dL   Albumin 2.6 (L) 3.5 - 5.0 g/dL   AST 23 15 - 41 U/L   ALT 20 17 - 63 U/L   Alkaline Phosphatase 78 38 - 126 U/L   Total Bilirubin 0.6 0.3 - 1.2 mg/dL   GFR calc non Af Amer 59 (L) >60 mL/min   GFR calc Af Amer >60 >60 mL/min    Comment: (NOTE) The eGFR has been calculated using the CKD EPI equation. This calculation has not been validated in all clinical situations. eGFR's persistently <60 mL/min signify possible Chronic Kidney Disease.    Anion gap 8 5 - 15  CBC     Status: Abnormal   Collection Time: 11/19/14   4:40 AM  Result Value Ref Range   WBC 4.5 4.0 - 10.5 K/uL   RBC 3.47 (L) 4.22 - 5.81 MIL/uL   Hemoglobin 9.8 (L) 13.0 - 17.0 g/dL   HCT 30.0 (L) 39.0 - 52.0 %   MCV 86.5 78.0 - 100.0 fL   MCH 28.2 26.0 - 34.0 pg   MCHC 32.7 30.0 - 36.0 g/dL   RDW 13.1 11.5 - 15.5 %   Platelets 280 150 - 400 K/uL  Protime-INR     Status: None   Collection Time: 11/19/14  4:40 AM  Result Value Ref Range   Prothrombin Time 15.1 11.6 - 15.2 seconds   INR 1.17 0.00 - 1.49  Sedimentation rate     Status: Abnormal   Collection Time: 11/19/14  4:40 AM  Result Value Ref Range   Sed Rate 80 (H) 0 - 16 mm/hr  C-reactive protein     Status: Abnormal   Collection Time: 11/19/14  4:40 AM  Result Value Ref Range   CRP 3.6 (H) <1.0 mg/dL    MICRO: 6/8 blood ngtd  IMAGING: Mr Cervical Spine W Wo Contrast  11/18/2014   CLINICAL DATA:  Severe neck pain extending into the upper extremities for 10 days. No fever. Abnormal x-rays.  EXAM: MRI CERVICAL SPINE WITHOUT AND WITH CONTRAST  TECHNIQUE: Multiplanar and multiecho pulse sequences of the cervical spine, to include the craniocervical junction and cervicothoracic junction, were obtained according to standard protocol without and with intravenous contrast.  CONTRAST:  15m MULTIHANCE GADOBENATE DIMEGLUMINE 529 MG/ML IV SOLN  COMPARISON:  Cervical spine radiographs 11/17/2014  FINDINGS: Normal signal is present in the cervical and upper thoracic spinal cord to the lowest imaged level T3-4. Abnormal marrow  signal and enhancement is present at C5-6 surrounding the disc space and in the disc space. Abnormal soft tissue enhancement is present within the prevertebral space measuring up to 5 mm. This extends cephalo caudad 2.5 cm. Mild dural enhancement or venous engorgement is present at this level measuring up to 3 mm within the ventral epidural space.  There is fusion across the C4-5 disc space and mild kyphosis at this level. Slight degenerative anterolisthesis is present at  C2-3. No other pathologic enhancement is present.  Craniocervical junction is within normal limits. The visualized intracranial contents are normal. There is no pathologic enhancement.  C2-3:  Negative.  C3-4: Mild left-sided uncovertebral spurring is present without significant focal stenosis.  C4-5: Mild facet hypertrophy is noted bilaterally. There is no focal stenosis.  C5-6: Abnormal enhancement is noted in the ventral epidural space as described above. This may represent infection or venous engorgement. There is abnormal enhancement and signal in the disc and adjacent endplates compatible with discitis osteomyelitis.  C6-7: A rightward disc osteophyte complex results in mild right foraminal stenosis. The central canal and left foramen are patent.  C7-T1:  Negative  IMPRESSION: 1. 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 2. Up to 5 mm of pathologic prevertebral soft tissue swelling consistent with infection/inflammation. 3. 3 mm of ventral dural soft tissue enhancement compatible with infection or venous engorgement. 4. Mild right foraminal narrowing at C6-7. These results were called by telephone at the time of interpretation on 11/18/2014 at 5:07 pm to Dr. [name] , who verbally acknowledged these results.   Electronically Signed   By: San Morelle M.D.   On: 11/18/2014 17:07   Assessment/Plan:  64yo M with neck pain found to have C5-C6 discitis, infection of unknown source  - will get another set of blood cultures x 2 today. First set may have been negative due to being on antibiotics recently - will start vancomycin and ceftriaxone 2gm IV daily at 1900 - recommend to have NSGY evaluate patietn to see if MRI suggests need for surgical intervention  - recommend to treat for 6 wk of IV therapy - can place picc line once we have 48-72hr of blood cx NGTD   Health maintenance = will check hep c and  hiv

## 2014-11-19 NOTE — Progress Notes (Signed)
IR PA aware of request for cervical biopsy given osteomyelitis. Case discussed with Dr. Estanislado Pandy and NIR unable to offer this procedure. Any questions please call Dr. Estanislado Pandy [phone]  Tsosie Billing PA-C Interventional Radiology  11/19/14  12:17 PM

## 2014-11-20 LAB — BASIC METABOLIC PANEL
ANION GAP: 10 (ref 5–15)
BUN: 25 mg/dL — AB (ref 6–20)
CO2: 26 mmol/L (ref 22–32)
CREATININE: 1.43 mg/dL — AB (ref 0.61–1.24)
Calcium: 8.3 mg/dL — ABNORMAL LOW (ref 8.9–10.3)
Chloride: 101 mmol/L (ref 101–111)
GFR calc Af Amer: 59 mL/min — ABNORMAL LOW (ref >60)
GFR calc non Af Amer: 51 mL/min — ABNORMAL LOW (ref >60)
Glucose, Bld: 93 mg/dL (ref 65–99)
Potassium: 4.2 mmol/L (ref 3.5–5.1)
Sodium: 137 mmol/L (ref 135–145)

## 2014-11-20 LAB — HIV ANTIBODY (ROUTINE TESTING W REFLEX): HIV Screen 4th Generation wRfx: NONREACTIVE

## 2014-11-20 NOTE — Care Management Note (Signed)
Case Management Note  Patient Details  Name: Jacob Logan MRN: [id number] Date of Birth: 02/10/1951  Subjective/Objective        Admitted with Osteomyelitis of the cervical spine          Action/Plan: Patient is to go home with IV antibiotics/ [facility] RN to assist with home antibiotics  Expected Discharge Date:    possibly 11/22/2014              Expected Discharge Plan:  [place]  In-House Referral:     Discharge planning Services  CM Consult  Post Acute Care Choice:    Choice offered to:   patient  HH Arranged:  RN [facility] Agency:  [place]  Status of Service:  In process, will continue to follow    Sherrilyn Rist [phone] 11/20/2014, 1:38 PM

## 2014-11-20 NOTE — Progress Notes (Signed)
[facility]    Date of Admission:  11/18/2014   Total days of antibiotics 2        Day 2 vanco        Day 2 ceftriaxone           ID: Jacob Logan is a 64 y.o. male with c5-c6 cervical osteo Principal Problem:   Osteomyelitis of cervical spine Active Problems:   Scoliosis (and kyphoscoliosis), idiopathic   Renal stone   Malnutrition of moderate degree    Subjective: Afebrile. Still neck pain. Tolerating iv antibioticx  Medications:  . cefTRIAXone (ROCEPHIN)  IV  2 g Intravenous Q24H  . feeding supplement (ENSURE ENLIVE)  237 mL Oral Q24H  . multivitamin with minerals  1 tablet Oral Daily  . sodium chloride  3 mL Intravenous Q12H  . tamsulosin  0.4 mg Oral Daily  . vancomycin  500 mg Intravenous Q12H    Objective: Vital signs in last 24 hours: Temp:  [97.8 F (36.6 C)-98.5 F (36.9 C)] 98.5 F (36.9 C) (06/10 1321) Pulse Rate:  [72-92] 88 (06/10 1321) Resp:  [16-18] 16 (06/10 1321) BP: (107-121)/(62-79) 114/71 mmHg (06/10 1321) SpO2:  [95 %-97 %] 96 % (06/10 1321) Weight:  [129 lb 12.8 oz (58.877 kg)] 129 lb 12.8 oz (58.877 kg) (06/10 1194) Physical Exam  Constitutional: He is oriented to person, place, and time. He appears well-developed and well-nourished. No distress.  HENT: in soft collar Mouth/Throat: Oropharynx is clear and moist. No oropharyngeal exudate.  Neurological: He is alert and oriented to person, place, and time.  Skin: Skin is warm and dry. No rash noted. No erythema.  Psychiatric: He has a normal mood and affect. His behavior is normal.     Lab Results  Recent Labs  11/18/14 1824 11/19/14 0440 11/20/14 0250  WBC 5.9 4.5  --   HGB 10.5* 9.8*  --   HCT 32.2* 30.0*  --   NA 136 138 137  K 4.4 4.0 4.2  CL 100* 103 101  CO2 26 27 26   BUN 22* 16 25*  CREATININE 1.35* 1.27* 1.43*   Liver Panel  Recent Labs  11/18/14 1824 11/19/14 0440  PROT 7.4 6.5  ALBUMIN 2.9* 2.6*  AST 26 23  ALT 19 20  ALKPHOS 93  78  BILITOT 0.5 0.6   Sedimentation Rate  Recent Labs  11/19/14 0440  ESRSEDRATE 80*   C-Reactive Protein  Recent Labs  11/19/14 0440  CRP 3.6*    Microbiology: 6/8 blood cx ngtd 6/9 blood cx ngtd Studies/Results: Mr Cervical Spine W Wo Contrast  11/18/2014   CLINICAL DATA:  Severe neck pain extending into the upper extremities for 10 days. No fever. Abnormal x-rays.  EXAM: MRI CERVICAL SPINE WITHOUT AND WITH CONTRAST  TECHNIQUE: Multiplanar and multiecho pulse sequences of the cervical spine, to include the craniocervical junction and cervicothoracic junction, were obtained according to standard protocol without and with intravenous contrast.  CONTRAST:  36mL MULTIHANCE GADOBENATE DIMEGLUMINE 529 MG/ML IV SOLN  COMPARISON:  Cervical spine radiographs 11/17/2014  FINDINGS: Normal signal is present in the cervical and upper thoracic spinal cord to the lowest imaged level T3-4. Abnormal marrow signal and enhancement is present at C5-6 surrounding the disc space and in the disc space. Abnormal soft tissue enhancement is present within the prevertebral space measuring up to 5 mm. This extends cephalo caudad 2.5 cm. Mild dural enhancement or venous engorgement is present at this level measuring up to  3 mm within the ventral epidural space.  There is fusion across the C4-5 disc space and mild kyphosis at this level. Slight degenerative anterolisthesis is present at C2-3. No other pathologic enhancement is present.  Craniocervical junction is within normal limits. The visualized intracranial contents are normal. There is no pathologic enhancement.  C2-3:  Negative.  C3-4: Mild left-sided uncovertebral spurring is present without significant focal stenosis.  C4-5: Mild facet hypertrophy is noted bilaterally. There is no focal stenosis.  C5-6: Abnormal enhancement is noted in the ventral epidural space as described above. This may represent infection or venous engorgement. There is abnormal enhancement  and signal in the disc and adjacent endplates compatible with discitis osteomyelitis.  C6-7: A rightward disc osteophyte complex results in mild right foraminal stenosis. The central canal and left foramen are patent.  C7-T1:  Negative  IMPRESSION: 1. 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 2. Up to 5 mm of pathologic prevertebral soft tissue swelling consistent with infection/inflammation. 3. 3 mm of ventral dural soft tissue enhancement compatible with infection or venous engorgement. 4. Mild right foraminal narrowing at C6-7. These results were called by telephone at the time of interpretation on 11/18/2014 at 5:07 pm to Dr. [name] , who verbally acknowledged these results.   Electronically Signed   By: San Morelle M.D.   On: 11/18/2014 17:07     Assessment/Plan: Cervical osteomyelitis, culture negative = will have him do 6 wk of ceftriaxone 2gm IV daily plus vancomycin ,goal of 15-20. If blood cx remain negative, can get picc line tomorrow.   Will need weekly cbc, bmp, vanco trough. Dose vanco per home health protocol  rtc in 4-6 wk in ID clinic  Will sign off  [place], [facility] Cell: [phone] Pager: [phone]  11/20/2014, 4:00 PM

## 2014-11-20 NOTE — Progress Notes (Signed)
[facility] PROGRESS NOTE   Jaylee Lantry ZLD:[id number] DOB: 1950-10-08 DOA: 11/18/2014 PCP: Rikki Spearing PHUONG, DO  HPI/Subjective: Seen with wife at bedside, denies any significant complaints. Denies any fever or chills have some dull aching pain in his neck.  Assessment/Plan: Principal Problem:   Osteomyelitis of cervical spine Active Problems:   Scoliosis (and kyphoscoliosis), idiopathic   Renal stone   Malnutrition of moderate degree    Osteomyelitis and discitis at C5-C6 level Patient presented to the hospital with neck pain, elevated CRP and ESR. MR of the apical spine showed osteomyelitis/discitis at C5-C6 levels. Neurosurgery recommended IR biopsy, discussed with IR who recommended empiric treatment rather than getting biopsy. Discussed again with Dr. Vertell Limber after ID recommended neurosurgery to recheck on him. Dr. Vertell Limber recommended empiric treatment for osteomyelitis, no drainable abscess or fluid collection. Appreciate Dr. Storm Frisk help, patient placed on vancomycin and Rocephin, placed PICC line in a.m.  Recent renal stone Patient thinks he passed it, no leukocytosis and no pain.  Malnutrition of moderate degree Per dietitian.   Code Status: Full Code Family Communication: Plan discussed with the patient. Disposition Plan: Remains inpatient Diet: Diet regular Room service appropriate?: Yes; Fluid consistency:: Thin  Consultants:  ID  Procedures:  None  Antibiotics:  None   Objective: Filed Vitals:   11/20/14 0607  BP: 121/79  Pulse: 78  Temp: 98.2 F (36.8 C)  Resp: 18    Intake/Output Summary (Last 24 hours) at 11/20/14 1101 Last data filed at 11/20/14 1035  Gross per 24 hour  Intake 2206.25 ml  Output   1325 ml  Net 881.25 ml   Filed Weights   11/18/14 2331 11/19/14 0602 11/20/14 0607  Weight: 58.786 kg (129 lb 9.6 oz) 58.196 kg (128 lb 4.8 oz) 58.877 kg (129 lb 12.8 oz)    Exam: General: Alert and awake, oriented x3, not in  any acute distress. HEENT: anicteric sclera, pupils reactive to light and accommodation, EOMI CVS: S1-S2 clear, no murmur rubs or gallops Chest: clear to auscultation bilaterally, no wheezing, rales or rhonchi Abdomen: soft nontender, nondistended, normal bowel sounds, no organomegaly Extremities: no cyanosis, clubbing or edema noted bilaterally Neuro: Cranial nerves II-XII intact, no focal neurological deficits  Data Reviewed: Basic Metabolic Panel:  Recent Labs Lab 11/17/14 0948 11/18/14 1824 11/19/14 0440 11/20/14 0250  NA 137 136 138 137  K 4.5 4.4 4.0 4.2  CL 101 100* 103 101  CO2 _0 GLUCOSE 93 97 88 93  BUN 19 22* 16 25*  CREATININE 1.24 1.35* 1.27* 1.43*  CALCIUM 8.8 8.7* 8.4* 8.3*   Liver Function Tests:  Recent Labs Lab 11/18/14 1824 11/19/14 0440  AST 26 23  ALT 19 20  ALKPHOS 93 78  BILITOT 0.5 0.6  PROT 7.4 6.5  ALBUMIN 2.9* 2.6*   No results for input(s): LIPASE, AMYLASE in the last 168 hours. No results for input(s): AMMONIA in the last 168 hours. CBC:  Recent Labs Lab 11/17/14 0956 11/18/14 1824 11/19/14 0440  WBC 7.9 5.9 4.5  NEUTROABS  --  4.1  --   HGB 10.3* 10.5* 9.8*  HCT 32.2* 32.2* 30.0*  MCV 85.6 87.0 86.5  PLT  --  291 280   Cardiac Enzymes: No results for input(s): CKTOTAL, CKMB, CKMBINDEX, TROPONINI in the last 168 hours. BNP (last 3 results) No results for input(s): BNP in the last 8760 hours.  ProBNP (last 3 results) No results for input(s): PROBNP in the last 8760 hours.  CBG:  No results for input(s): GLUCAP in the last 168 hours.  Micro Recent Results (from the past 240 hour(s))  Urine culture     Status: None   Collection Time: 11/17/14  9:52 AM  Result Value Ref Range Status   Colony Count NO GROWTH  Final   Organism ID, Bacteria NO GROWTH  Final  Blood culture (routine x 2)     Status: None (Preliminary result)   Collection Time: 11/18/14  6:24 PM  Result Value Ref Range Status   Specimen  Description BLOOD RIGHT ARM  Final   Special Requests BOTTLES DRAWN AEROBIC AND ANAEROBIC 2CC  Final   Culture   Final           BLOOD CULTURE RECEIVED NO GROWTH TO DATE CULTURE WILL BE HELD FOR 5 DAYS BEFORE ISSUING A FINAL NEGATIVE REPORT Note: Culture results may be compromised due to an inadequate volume of blood received in culture bottles. Performed at [facility]    Report Status PENDING  Incomplete  Blood culture (routine x 2)     Status: None (Preliminary result)   Collection Time: 11/18/14  6:46 PM  Result Value Ref Range Status   Specimen Description BLOOD LEFT ARM  Final   Special Requests BOTTLES DRAWN AEROBIC AND ANAEROBIC 5CC  Final   Culture   Final           BLOOD CULTURE RECEIVED NO GROWTH TO DATE CULTURE WILL BE HELD FOR 5 DAYS BEFORE ISSUING A FINAL NEGATIVE REPORT Performed at [facility]    Report Status PENDING  Incomplete     Studies: Mr Cervical Spine W Wo Contrast  11/18/2014   CLINICAL DATA:  Severe neck pain extending into the upper extremities for 10 days. No fever. Abnormal x-rays.  EXAM: MRI CERVICAL SPINE WITHOUT AND WITH CONTRAST  TECHNIQUE: Multiplanar and multiecho pulse sequences of the cervical spine, to include the craniocervical junction and cervicothoracic junction, were obtained according to standard protocol without and with intravenous contrast.  CONTRAST:  12m MULTIHANCE GADOBENATE DIMEGLUMINE 529 MG/ML IV SOLN  COMPARISON:  Cervical spine radiographs 11/17/2014  FINDINGS: Normal signal is present in the cervical and upper thoracic spinal cord to the lowest imaged level T3-4. Abnormal marrow signal and enhancement is present at C5-6 surrounding the disc space and in the disc space. Abnormal soft tissue enhancement is present within the prevertebral space measuring up to 5 mm. This extends cephalo caudad 2.5 cm. Mild dural enhancement or venous engorgement is present at this level measuring up to 3 mm within the ventral epidural  space.  There is fusion across the C4-5 disc space and mild kyphosis at this level. Slight degenerative anterolisthesis is present at C2-3. No other pathologic enhancement is present.  Craniocervical junction is within normal limits. The visualized intracranial contents are normal. There is no pathologic enhancement.  C2-3:  Negative.  C3-4: Mild left-sided uncovertebral spurring is present without significant focal stenosis.  C4-5: Mild facet hypertrophy is noted bilaterally. There is no focal stenosis.  C5-6: Abnormal enhancement is noted in the ventral epidural space as described above. This may represent infection or venous engorgement. There is abnormal enhancement and signal in the disc and adjacent endplates compatible with discitis osteomyelitis.  C6-7: A rightward disc osteophyte complex results in mild right foraminal stenosis. The central canal and left foramen are patent.  C7-T1:  Negative  IMPRESSION: 1. Discitis osteomyelitis is confirmed at the C5-6 level with abnormal signal and enhancement in the disc  space and adjacent endplates. 2. Up to 5 mm of pathologic prevertebral soft tissue swelling consistent with infection/inflammation. 3. 3 mm of ventral dural soft tissue enhancement compatible with infection or venous engorgement. 4. Mild right foraminal narrowing at C6-7. These results were called by telephone at the time of interpretation on 11/18/2014 at 5:07 pm to Dr. [name] , who verbally acknowledged these results.   Electronically Signed   By: San Morelle M.D.   On: 11/18/2014 17:07    Scheduled Meds: . cefTRIAXone (ROCEPHIN)  IV  2 g Intravenous Q24H  . feeding supplement (ENSURE ENLIVE)  237 mL Oral Q24H  . multivitamin with minerals  1 tablet Oral Daily  . sodium chloride  3 mL Intravenous Q12H  . tamsulosin  0.4 mg Oral Daily  . vancomycin  500 mg Intravenous Q12H   Continuous Infusions: . sodium chloride 75 mL/hr at 11/20/14 0843       Time spent: 35  minutes    [facility]  [facility] Pager [phone] If 7PM-7AM, please contact night-coverage at [email or web address], password [facility] 11/20/2014, 11:01 AM  LOS: 2 days

## 2014-11-21 LAB — HEPATITIS C ANTIBODY

## 2014-11-21 MED ORDER — SODIUM CHLORIDE 0.9 % IJ SOLN
10.0000 mL | INTRAMUSCULAR | Status: DC | PRN
  Administered 2014-11-21 – 2014-11-22 (×2): 10 mL
  Filled 2014-11-21 (×2): qty 40

## 2014-11-21 NOTE — Progress Notes (Signed)
ANTIBIOTIC CONSULT NOTE - FOLLOW UP  Pharmacy Consult for Vancomycin Indication: osteomyelitis  No Known Allergies  Patient Measurements: Height: 5\' 9"  (175.3 cm) Weight: 132 lb 3.2 oz (59.966 kg) (scale a) IBW/kg (Calculated) : 70.7  Vital Signs: Temp: 97.6 F (36.4 C) (06/11 0900) Temp Source: Oral (06/11 0900) BP: 121/77 mmHg (06/11 0900) Pulse Rate: 90 (06/11 0900) Intake/Output from previous day: 06/10 0701 - 06/11 0700 In: 5337.3 [P.O.:1828; I.V.:3209.3; IV Piggyback:300] Out: 1450 [Urine:1450] Intake/Output from this shift: Total I/O In: 240 [P.O.:240] Out: 600 [Urine:600]  Labs:  Recent Labs  11/18/14 1824 11/19/14 0440 11/20/14 0250  WBC 5.9 4.5  --   HGB 10.5* 9.8*  --   PLT 291 280  --   CREATININE 1.35* 1.27* 1.43*   Estimated Creatinine Clearance: 45.5 mL/min (by C-G formula based on Cr of 1.43).  Assessment:  Day # 3 Vancomycin and Ceftriaxone for cervical osteomyelitis.  Planning 6 weeks of therapy.  PICC line placed today.  BUN/Creatinine have trended up some. On NS at 100 ml/hr.  Afebrile. Blood cultures negative to date.  Goal of Therapy:  Vancomycin trough level 15-20 mcg/ml  Plan:   Continue Vancomycin 500 mg IV q12hrs. Currently 10am/10pm.  Will check vancomycin trough level prior to 10am dose on 11/22/11.  Also on Ceftriaxone 2 grams IV q24hrs.  Bmet and CBC with trough level.  Will need weekly vanc trough, bmet and CBC after discharge.  Arty Baumgartner,  Pager: [phone] 11/21/2014,1:48 PM

## 2014-11-21 NOTE — Progress Notes (Signed)
[facility] PROGRESS NOTE   Jacob Logan SWH:[id number] DOB: 15-May-1951 DOA: 11/18/2014 PCP: Rikki Spearing PHUONG, DO  HPI/Subjective: Seen with wife at bedside, orders placed for PICC line today. Likely to be discharged in the morning with home health service for IV antibiotics.  Assessment/Plan: Principal Problem:   Osteomyelitis of cervical spine Active Problems:   Scoliosis (and kyphoscoliosis), idiopathic   Renal stone   Malnutrition of moderate degree    Osteomyelitis and discitis at C5-C6 level Patient presented to the hospital with neck pain, elevated CRP and ESR. MR of the apical spine showed osteomyelitis/discitis at C5-C6 levels. Neurosurgery recommended IR biopsy, discussed with IR who recommended empiric treatment rather than getting biopsy. Discussed again with Dr. Vertell Limber after ID recommended neurosurgery to recheck on him. Dr. Vertell Limber recommended empiric treatment for osteomyelitis, no drainable abscess or fluid collection. Appreciate Dr. Storm Frisk help, patient placed on vancomycin and Rocephin, placed PICC line in a.m. No new complaints, continue current plan, likely to be discharged in a.m.  Recent renal stone Patient thinks he passed it, no leukocytosis and no pain.  Malnutrition of moderate degree Per dietitian.   Code Status: Full Code Family Communication: Plan discussed with the patient. Disposition Plan: Remains inpatient Diet: Diet regular Room service appropriate?: Yes; Fluid consistency:: Thin  Consultants:  ID  Procedures:  None  Antibiotics:  None   Objective: Filed Vitals:   11/21/14 0900  BP: 121/77  Pulse: 90  Temp: 97.6 F (36.4 C)  Resp: 20    Intake/Output Summary (Last 24 hours) at 11/21/14 1128 Last data filed at 11/21/14 1000  Gross per 24 hour  Intake 5042.25 ml  Output   2050 ml  Net 2992.25 ml   Filed Weights   11/19/14 0602 11/20/14 0607 11/21/14 0542  Weight: 58.196 kg (128 lb 4.8 oz) 58.877 kg (129 lb  12.8 oz) 59.966 kg (132 lb 3.2 oz)    Exam: General: Alert and awake, oriented x3, not in any acute distress. HEENT: anicteric sclera, pupils reactive to light and accommodation, EOMI CVS: S1-S2 clear, no murmur rubs or gallops Chest: clear to auscultation bilaterally, no wheezing, rales or rhonchi Abdomen: soft nontender, nondistended, normal bowel sounds, no organomegaly Extremities: no cyanosis, clubbing or edema noted bilaterally Neuro: Cranial nerves II-XII intact, no focal neurological deficits  Data Reviewed: Basic Metabolic Panel:  Recent Labs Lab 11/17/14 0948 11/18/14 1824 11/19/14 0440 11/20/14 0250  NA 137 136 138 137  K 4.5 4.4 4.0 4.2  CL 101 100* 103 101  CO2 $Re'27 26 27 26  'pVX$ GLUCOSE 93 97 88 93  BUN 19 22* 16 25*  CREATININE 1.24 1.35* 1.27* 1.43*  CALCIUM 8.8 8.7* 8.4* 8.3*   Liver Function Tests:  Recent Labs Lab 11/18/14 1824 11/19/14 0440  AST 26 23  ALT 19 20  ALKPHOS 93 78  BILITOT 0.5 0.6  PROT 7.4 6.5  ALBUMIN 2.9* 2.6*   No results for input(s): LIPASE, AMYLASE in the last 168 hours. No results for input(s): AMMONIA in the last 168 hours. CBC:  Recent Labs Lab 11/17/14 0956 11/18/14 1824 11/19/14 0440  WBC 7.9 5.9 4.5  NEUTROABS  --  4.1  --   HGB 10.3* 10.5* 9.8*  HCT 32.2* 32.2* 30.0*  MCV 85.6 87.0 86.5  PLT  --  291 280   Cardiac Enzymes: No results for input(s): CKTOTAL, CKMB, CKMBINDEX, TROPONINI in the last 168 hours. BNP (last 3 results) No results for input(s): BNP in the last 8760 hours.  ProBNP (  last 3 results) No results for input(s): PROBNP in the last 8760 hours.  CBG: No results for input(s): GLUCAP in the last 168 hours.  Micro Recent Results (from the past 240 hour(s))  Urine culture     Status: None   Collection Time: 11/17/14  9:52 AM  Result Value Ref Range Status   Colony Count NO GROWTH  Final   Organism ID, Bacteria NO GROWTH  Final  Blood culture (routine x 2)     Status: None (Preliminary  result)   Collection Time: 11/18/14  6:24 PM  Result Value Ref Range Status   Specimen Description BLOOD RIGHT ARM  Final   Special Requests BOTTLES DRAWN AEROBIC AND ANAEROBIC 2CC  Final   Culture   Final           BLOOD CULTURE RECEIVED NO GROWTH TO DATE CULTURE WILL BE HELD FOR 5 DAYS BEFORE ISSUING A FINAL NEGATIVE REPORT Note: Culture results may be compromised due to an inadequate volume of blood received in culture bottles. Performed at [facility]    Report Status PENDING  Incomplete  Blood culture (routine x 2)     Status: None (Preliminary result)   Collection Time: 11/18/14  6:46 PM  Result Value Ref Range Status   Specimen Description BLOOD LEFT ARM  Final   Special Requests BOTTLES DRAWN AEROBIC AND ANAEROBIC 5CC  Final   Culture   Final           BLOOD CULTURE RECEIVED NO GROWTH TO DATE CULTURE WILL BE HELD FOR 5 DAYS BEFORE ISSUING A FINAL NEGATIVE REPORT Performed at [facility]    Report Status PENDING  Incomplete  Culture, blood (routine x 2)     Status: None (Preliminary result)   Collection Time: 11/19/14  6:30 PM  Result Value Ref Range Status   Specimen Description BLOOD LEFT ARM  Final   Special Requests BOTTLES DRAWN AEROBIC AND ANAEROBIC 10CC  Final   Culture   Final           BLOOD CULTURE RECEIVED NO GROWTH TO DATE CULTURE WILL BE HELD FOR 5 DAYS BEFORE ISSUING A FINAL NEGATIVE REPORT Performed at [facility]    Report Status PENDING  Incomplete  Culture, blood (routine x 2)     Status: None (Preliminary result)   Collection Time: 11/19/14  6:40 PM  Result Value Ref Range Status   Specimen Description BLOOD LEFT FOREARM  Final   Special Requests BOTTLES DRAWN AEROBIC AND ANAEROBIC 10CC  Final   Culture   Final           BLOOD CULTURE RECEIVED NO GROWTH TO DATE CULTURE WILL BE HELD FOR 5 DAYS BEFORE ISSUING A FINAL NEGATIVE REPORT Performed at [facility]    Report Status PENDING  Incomplete     Studies: No  results found.  Scheduled Meds: . cefTRIAXone (ROCEPHIN)  IV  2 g Intravenous Q24H  . feeding supplement (ENSURE ENLIVE)  237 mL Oral Q24H  . multivitamin with minerals  1 tablet Oral Daily  . sodium chloride  3 mL Intravenous Q12H  . tamsulosin  0.4 mg Oral Daily  . vancomycin  500 mg Intravenous Q12H   Continuous Infusions: . sodium chloride 100 mL/hr at 11/21/14 0917       Time spent: 35 minutes    [facility]  [facility] Pager [phone] If 7PM-7AM, please contact night-coverage at [email or web address], password [facility] 11/21/2014, 11:28 AM  LOS: 3 days

## 2014-11-21 NOTE — Progress Notes (Signed)
Peripherally Inserted Central Catheter/Midline Placement  The IV Nurse has discussed with the patient and/or persons authorized to consent for the patient, the purpose of this procedure and the potential benefits and risks involved with this procedure.  The benefits include less needle sticks, lab draws from the catheter and patient may be discharged home with the catheter.  Risks include, but not limited to, infection, bleeding, blood clot (thrombus formation), and puncture of an artery; nerve damage and irregular heat beat.  Alternatives to this procedure were also discussed.  PICC/Midline Placement Documentation  PICC / Midline Single Lumen 19/37/90 PICC Right Basilic 42 cm 0 cm (Active)  Indication for Insertion or Continuance of Line Home intravenous therapies (PICC only) 11/21/2014 11:55 AM  Exposed Catheter (cm) 0 cm 11/21/2014 11:55 AM  Site Assessment Clean;Dry;Intact 11/21/2014 11:55 AM  Line Status Flushed;Saline locked;Blood return noted 11/21/2014 11:55 AM  Dressing Type Transparent 11/21/2014 11:55 AM  Dressing Status Clean;Intact;Dry;Antimicrobial disc in place 11/21/2014 11:55 AM  Line Care Connections checked and tightened 11/21/2014 11:55 AM  Line Adjustment (NICU/IV Team Only) No 11/21/2014 11:55 AM  Dressing Intervention New dressing 11/21/2014 11:55 AM  Dressing Change Due 11/28/14 11/21/2014 11:55 AM       Rolena Infante 11/21/2014, 11:55 AM

## 2014-11-22 DIAGNOSIS — N183 Chronic kidney disease, stage 3 unspecified: Secondary | ICD-10-CM | POA: Diagnosis present

## 2014-11-22 LAB — CBC
HCT: 35.5 % — ABNORMAL LOW (ref 39.0–52.0)
Hemoglobin: 11.2 g/dL — ABNORMAL LOW (ref 13.0–17.0)
MCH: 27.4 pg (ref 26.0–34.0)
MCHC: 31.5 g/dL (ref 30.0–36.0)
MCV: 86.8 fL (ref 78.0–100.0)
PLATELETS: 298 10*3/uL (ref 150–400)
RBC: 4.09 MIL/uL — ABNORMAL LOW (ref 4.22–5.81)
RDW: 13.1 % (ref 11.5–15.5)
WBC: 4.9 10*3/uL (ref 4.0–10.5)

## 2014-11-22 LAB — BASIC METABOLIC PANEL
Anion gap: 7 (ref 5–15)
BUN: 17 mg/dL (ref 6–20)
CO2: 27 mmol/L (ref 22–32)
Calcium: 8.7 mg/dL — ABNORMAL LOW (ref 8.9–10.3)
Chloride: 105 mmol/L (ref 101–111)
Creatinine, Ser: 1.14 mg/dL (ref 0.61–1.24)
GFR calc Af Amer: >60 mL/min (ref >60)
GFR calc non Af Amer: >60 mL/min (ref >60)
GLUCOSE: 108 mg/dL — AB (ref 65–99)
Potassium: 3.6 mmol/L (ref 3.5–5.1)
Sodium: 139 mmol/L (ref 135–145)

## 2014-11-22 LAB — VANCOMYCIN, TROUGH: Vancomycin Tr: 10 ug/mL (ref 10.0–20.0)

## 2014-11-22 MED ORDER — VANCOMYCIN HCL IN DEXTROSE 750-5 MG/150ML-% IV SOLN
750.0000 mg | Freq: Two times a day (BID) | INTRAVENOUS | Status: DC
Start: 2014-11-22 — End: 2014-11-22
  Administered 2014-11-22: 750 mg via INTRAVENOUS
  Filled 2014-11-22 (×2): qty 150

## 2014-11-22 MED ORDER — CEFTRIAXONE SODIUM 2 G IV SOLR: 2.0000 g | INTRAVENOUS | Status: DC

## 2014-11-22 MED ORDER — SODIUM CHLORIDE 0.9 % IV SOLN: 500.0000 mg | Freq: Two times a day (BID) | INTRAVENOUS | Status: DC

## 2014-11-22 MED ORDER — VANCOMYCIN HCL IN DEXTROSE 750-5 MG/150ML-% IV SOLN: 750.0000 mg | Freq: Two times a day (BID) | INTRAVENOUS | Status: DC

## 2014-11-22 MED ORDER — HEPARIN SOD (PORK) LOCK FLUSH 100 UNIT/ML IV SOLN
250.0000 [IU] | INTRAVENOUS | Status: AC | PRN
  Administered 2014-11-22: 250 [IU]

## 2014-11-22 NOTE — Progress Notes (Signed)
Jacob Logan discharged home per MD order. Discharge instructions reviewed and discussed with patient. All questions and concerns answered. Copy of instructions and scripts given to patient. IV removed.  Patient escorted to car by staff in a wheelchair. No distress noted upon discharge.   Tarri Abernethy R 11/22/2014 1:07 PM

## 2014-11-22 NOTE — Progress Notes (Signed)
ANTIBIOTIC CONSULT NOTE - FOLLOW UP  Pharmacy Consult for Vancomycin Indication: osteomyelitis  No Known Allergies  Patient Measurements: Height: 5\' 9"  (175.3 cm) Weight: 132 lb 4.8 oz (60.011 kg) (Scale A) IBW/kg (Calculated) : 70.7  Vital Signs: Temp: 97.7 F (36.5 C) (06/12 0600) Temp Source: Oral (06/12 0600) BP: 113/78 mmHg (06/12 0600) Pulse Rate: 63 (06/12 0600) Intake/Output from previous day: 06/11 0701 - 06/12 0700 In: 3830 [P.O.:1200; I.V.:2380; IV Piggyback:250] Out: 2831 [Urine:3225]  Labs:  Recent Labs  11/20/14 0250 11/22/14 0920  WBC  --  4.9  HGB  --  11.2*  PLT  --  298  CREATININE 1.43* 1.14   Estimated Creatinine Clearance: 57 mL/min (by C-G formula based on Cr of 1.14).  Assessment:  Day # 4 Vancomycin and Ceftriaxone for cervical osteomyelitis.  Planning 6 weeks of therapy.  PICC line placed today.  BUN/Creatinine had trended up some, but back down today.   Afebrile. Blood cultures negative to date.   Vanc trough level 10 mcg/ml prior to this morning's dose. Below target. This morning's dose was held while level in process.  Goal of Therapy:  Vancomycin trough level 15-20 mcg/ml  Plan:   Increase Vancomycin from 500 mg to 750 mg IV q12hrs.   First dose of new regimen prior to discharge today.  Also on Ceftriaxone 2 grams IV q24hrs in the evening.  Outpatient antibiotics through [facility].  Will need weekly vanc trough, bmet and CBC after discharge.  Arty Baumgartner, [place] Pager: [phone] 11/22/2014,11:23 AM

## 2014-11-22 NOTE — Discharge Summary (Addendum)
Physician Discharge Summary  Jacob Logan FWY:[id number] DOB: 09-05-1950 DOA: 11/18/2014  PCP: Leotis Pain, DO  Admit date: 11/18/2014 Discharge date: 11/22/2014  Time spent: 40 minutes  Recommendations for Outpatient Follow-up:  1. Follow-up with primary care physician within one week. 2. Follow-up with Dr. Baxter Flattery in 2 weeks. 3. Continue IV vancomycin at 750 mg daily and Rocephin 2 g daily for total of 6 weeks, and date is 01/01/2015. 4. Weekly BMP and CBC, please fax to Dr. Baxter Flattery office.  Discharge Diagnoses:  Principal Problem:   Osteomyelitis of cervical spine Active Problems:   Scoliosis (and kyphoscoliosis), idiopathic   Renal stone   Malnutrition of moderate degree   CKD (chronic kidney disease), stage III   Discharge Condition: Stable  Diet recommendation: Heart healthy  Filed Weights   11/20/14 0607 11/21/14 0542 11/22/14 0600  Weight: 58.877 kg (129 lb 12.8 oz) 59.966 kg (132 lb 3.2 oz) 60.011 kg (132 lb 4.8 oz)    History of present illness:  Jacob Logan is a 64 y.o. male with Past medical history of renal stones. The patient presents with complaints of neck pain. The pain has been ongoing for last few weeks and progressively worsening and does not improve. The patient had history of renal stone in the past and had another episode of renal stone this time and was started on ciprofloxacin 2 days ago by the PCP. Due to his recurrent neck pain at x-ray of the cervical spine was obtained. The spine x-ray was inconclusive and therefore an MRI was obtained which was showing discitis with signs of osteomyelitis. Therefore the patient was brought to the hospital. Next and the patient denies any complains of tingling or paresthesia or focal weakness. He denies any loss of control of bowel or bladder. He denies any prior motor vehicle injury prior or trauma or injury. He denies any changes in his medication.  The patient is coming from home. And at his baseline independent  for most of his ADL.   Hospital Course:   Osteomyelitis and discitis at C5-C6 level Patient presented to the hospital with neck pain, elevated CRP and ESR. MR of the apical spine showed osteomyelitis/discitis at C5-C6 levels. Neurosurgery recommended IR biopsy, discussed with IR who recommended empiric treatment rather than getting biopsy. Discussed again with Dr. Vertell Limber after ID recommended neurosurgery to recheck on him. Dr. Vertell Limber recommended empiric treatment for osteomyelitis, no drainable abscess or fluid collection. Appreciate Dr. Storm Frisk help, patient placed on vancomycin and Rocephin, PICC line placed Discharged on vancomycin and Rocephin for total of 6 weeks.  Recent renal stone Patient thinks he passed it, no leukocytosis and no pain.  Probable CKD stage III Patient presented with creatinine of 1.24, in the hospital creatinine was ranging between 1.2 and 1.4. Looking at the [facility] from 2015 and 2014, patient appears to have CKD with baseline 1.2-1.4.  Malnutrition of moderate degree Encourage food intake   Procedures:  None  Consultations:  ID  Over the phone consultation with both neurosurgery and interventional radiology.  Discharge Exam: Filed Vitals:   11/22/14 0600  BP: 113/78  Pulse: 63  Temp: 97.7 F (36.5 C)  Resp: 20   General: Alert and awake, oriented x3, not in any acute distress. HEENT: anicteric sclera, pupils reactive to light and accommodation, EOMI CVS: S1-S2 clear, no murmur rubs or gallops Chest: clear to auscultation bilaterally, no wheezing, rales or rhonchi Abdomen: soft nontender, nondistended, normal bowel sounds, no organomegaly Extremities: no cyanosis, clubbing or edema noted  bilaterally Neuro: Cranial nerves II-XII intact, no focal neurological deficits  Discharge Instructions   Discharge Instructions    Diet - low sodium heart healthy    Complete by:  As directed      Increase activity slowly    Complete by:  As directed            Current Discharge Medication List    START taking these medications   Details  cefTRIAXone (ROCEPHIN) 2 G SOLR injection Inject 2 g into the vein daily. Qty: 38 each, Refills: 0    Vancomycin (VANCOCIN) 750 MG/150ML SOLN Inject 150 mLs (750 mg total) into the vein every 12 (twelve) hours. Qty: 150 mL, Refills: 0      CONTINUE these medications which have NOT CHANGED   Details  HYDROcodone-acetaminophen (NORCO/VICODIN) 5-325 MG per tablet Take 1 tablet by mouth every 6 (six) hours as needed for moderate pain. Qty: 15 tablet, Refills: 0   Associated Diagnoses: Neck pain, bilateral    IRON PO Take 1 tablet by mouth daily.    tamsulosin (FLOMAX) 0.4 MG CAPS capsule Take 1 capsule (0.4 mg total) by mouth daily. Qty: 30 capsule, Refills: 3   Associated Diagnoses: Renal stones      STOP taking these medications     ciprofloxacin (CIPRO) 500 MG tablet        No Known Allergies Follow-up Information    Follow up with LE, THAO PHUONG, DO In 1 week.   Specialty:  Family Medicine   Contact information:   [place] [address] [phone]       Follow up with Carlyle Basques, MD In 2 weeks.   Specialty:  Infectious Diseases   Contact information:   [address] [place] [address] [phone]        The results of significant diagnostics from this hospitalization (including imaging, microbiology, ancillary and laboratory) are listed below for reference.    Significant Diagnostic Studies: Dg Chest 2 View  11/03/2014   CLINICAL DATA:  Cough, shortness of breath.  EXAM: CHEST  2 VIEW  COMPARISON:  05/18/2013  FINDINGS: Rightward scoliosis in the thoracic spine. Linear subsegmental atelectasis at the left base. Right lung is clear. Heart is normal size. No effusions. No acute bony abnormality.  IMPRESSION: Minimal lingular subsegmental atelectasis.  Scoliosis.   Electronically Signed   By: Rolm Baptise M.D.   On: 11/03/2014 15:54    Dg Cervical Spine Complete  11/17/2014   CLINICAL DATA:  Neck pain for 11 days.  No known injury.  EXAM: CERVICAL SPINE  4+ VIEWS  COMPARISON:  None.  FINDINGS: Loss of normal cervical lordosis with diffuse severe multilevel degenerative change. Loss of disc space noted C4-C5 and C5-C6. Although these changes may be secondary to degenerative change erosive changes cannot be excluded and an active process including discitis cannot be excluded. MRI of the cervical spine may prove useful for further evaluation. Mild apical pleural thickening noted consistent with scarring.  IMPRESSION: 1. Loss of normal cervical lordosis with severe multilevel degenerative change.  2. Severe loss of disc space at C4-C5 and C5-C6 with possible erosive changes along the endplates. A process such as diskitis cannot be excluded and gadolinium-enhanced MRI of the cervical spine suggestive for further evaluation. These results will be called to the ordering clinician or representative by the Radiologist Assistant, and communication documented in the PACS or zVision Dashboard.   Electronically Signed   By: Marcello Moores  Register  On: 11/17/2014 11:02   Mr Cervical Spine W Wo Contrast  11/18/2014   CLINICAL DATA:  Severe neck pain extending into the upper extremities for 10 days. No fever. Abnormal x-rays.  EXAM: MRI CERVICAL SPINE WITHOUT AND WITH CONTRAST  TECHNIQUE: Multiplanar and multiecho pulse sequences of the cervical spine, to include the craniocervical junction and cervicothoracic junction, were obtained according to standard protocol without and with intravenous contrast.  CONTRAST:  22m MULTIHANCE GADOBENATE DIMEGLUMINE 529 MG/ML IV SOLN  COMPARISON:  Cervical spine radiographs 11/17/2014  FINDINGS: Normal signal is present in the cervical and upper thoracic spinal cord to the lowest imaged level T3-4. Abnormal marrow signal and enhancement is present at C5-6 surrounding the disc space and in the disc space. Abnormal soft tissue  enhancement is present within the prevertebral space measuring up to 5 mm. This extends cephalo caudad 2.5 cm. Mild dural enhancement or venous engorgement is present at this level measuring up to 3 mm within the ventral epidural space.  There is fusion across the C4-5 disc space and mild kyphosis at this level. Slight degenerative anterolisthesis is present at C2-3. No other pathologic enhancement is present.  Craniocervical junction is within normal limits. The visualized intracranial contents are normal. There is no pathologic enhancement.  C2-3:  Negative.  C3-4: Mild left-sided uncovertebral spurring is present without significant focal stenosis.  C4-5: Mild facet hypertrophy is noted bilaterally. There is no focal stenosis.  C5-6: Abnormal enhancement is noted in the ventral epidural space as described above. This may represent infection or venous engorgement. There is abnormal enhancement and signal in the disc and adjacent endplates compatible with discitis osteomyelitis.  C6-7: A rightward disc osteophyte complex results in mild right foraminal stenosis. The central canal and left foramen are patent.  C7-T1:  Negative  IMPRESSION: 1. 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 2. Up to 5 mm of pathologic prevertebral soft tissue swelling consistent with infection/inflammation. 3. 3 mm of ventral dural soft tissue enhancement compatible with infection or venous engorgement. 4. Mild right foraminal narrowing at C6-7. These results were called by telephone at the time of interpretation on 11/18/2014 at 5:07 pm to Dr. JMerri Ray, who verbally acknowledged these results.   Electronically Signed   By: CSan MorelleM.D.   On: 11/18/2014 17:07    Microbiology: Recent Results (from the past 240 hour(s))  Urine culture     Status: None   Collection Time: 11/17/14  9:52 AM  Result Value Ref Range Status   Colony  Count NO GROWTH  Final   Organism ID, Bacteria NO GROWTH  Final  Blood culture (routine x 2)     Status: None (Preliminary result)   Collection Time: 11/18/14  6:24 PM  Result Value Ref Range Status   Specimen Description BLOOD RIGHT ARM  Final   Special Requests BOTTLES DRAWN AEROBIC AND ANAEROBIC 2CC  Final   Culture   Final           BLOOD CULTURE RECEIVED NO GROWTH TO DATE CULTURE WILL BE HELD FOR 5 DAYS BEFORE ISSUING A FINAL NEGATIVE REPORT Note: Culture results may be compromised due to an inadequate volume of blood received in culture bottles. [facility]formed at [facility]   Report Status PENDING  Incomplete  Blood culture (routine x 2)     Status: None (Preliminary result)   Collection Time: 11/18/14  6:46 PM  Result  Value Ref Range Status   Specimen Description BLOOD LEFT ARM  Final   Special Requests BOTTLES DRAWN AEROBIC AND ANAEROBIC 5CC  Final   Culture   Final           BLOOD CULTURE RECEIVED NO GROWTH TO DATE CULTURE WILL BE HELD FOR 5 DAYS BEFORE ISSUING A FINAL NEGATIVE REPORT Performed at [facility]    Report Status PENDING  Incomplete  Culture, blood (routine x 2)     Status: None (Preliminary result)   Collection Time: 11/19/14  6:30 PM  Result Value Ref Range Status   Specimen Description BLOOD LEFT ARM  Final   Special Requests BOTTLES DRAWN AEROBIC AND ANAEROBIC 10CC  Final   Culture   Final           BLOOD CULTURE RECEIVED NO GROWTH TO DATE CULTURE WILL BE HELD FOR 5 DAYS BEFORE ISSUING A FINAL NEGATIVE REPORT Performed at [facility]    Report Status PENDING  Incomplete  Culture, blood (routine x 2)     Status: None (Preliminary result)   Collection Time: 11/19/14  6:40 PM  Result Value Ref Range Status   Specimen Description BLOOD LEFT FOREARM  Final   Special Requests BOTTLES DRAWN AEROBIC AND ANAEROBIC 10CC  Final   Culture   Final           BLOOD CULTURE RECEIVED NO GROWTH TO DATE CULTURE WILL BE HELD FOR 5 DAYS BEFORE  ISSUING A FINAL NEGATIVE REPORT Performed at [facility]    Report Status PENDING  Incomplete     Labs: Basic Metabolic Panel:  Recent Labs Lab 11/17/14 0948 11/18/14 1824 11/19/14 0440 11/20/14 0250 11/22/14 0920  NA 137 136 138 137 139  K 4.5 4.4 4.0 4.2 3.6  CL 101 100* 103 101 105  CO2 _0 GLUCOSE 93 97 88 93 108*  BUN 19 22* 16 25* 17  CREATININE 1.24 1.35* 1.27* 1.43* 1.14  CALCIUM 8.8 8.7* 8.4* 8.3* 8.7*   Liver Function Tests:  Recent Labs Lab 11/18/14 1824 11/19/14 0440  AST 26 23  ALT 19 20  ALKPHOS 93 78  BILITOT 0.5 0.6  PROT 7.4 6.5  ALBUMIN 2.9* 2.6*   No results for input(s): LIPASE, AMYLASE in the last 168 hours. No results for input(s): AMMONIA in the last 168 hours. CBC:  Recent Labs Lab 11/17/14 0956 11/18/14 1824 11/19/14 0440 11/22/14 0920  WBC 7.9 5.9 4.5 4.9  NEUTROABS  --  4.1  --   --   HGB 10.3* 10.5* 9.8* 11.2*  HCT 32.2* 32.2* 30.0* 35.5*  MCV 85.6 87.0 86.5 86.8  PLT  --  291 280 298   Cardiac Enzymes: No results for input(s): CKTOTAL, CKMB, CKMBINDEX, TROPONINI in the last 168 hours. BNP: BNP (last 3 results) No results for input(s): BNP in the last 8760 hours.  ProBNP (last 3 results) No results for input(s): PROBNP in the last 8760 hours.  CBG: No results for input(s): GLUCAP in the last 168 hours.     Signed:  Linzee Depaul A  [facility] 11/22/2014, 10:52 AM

## 2014-11-22 NOTE — Care Management Note (Signed)
Case Management Note  Patient Details  Name: Jacob Logan MRN: [id number] Date of Birth: [birth year]  Subjective/Objective:                   Neck pain; osteomyelitis Action/Plan: Discharge planning  Expected Discharge Date:  11/22/14               Expected Discharge Plan:  [place]  In-House Referral:     Discharge planning Services  CM Consult  Post Acute Care Choice:    Choice offered to:     DME Arranged:  IV pump/equipment DME Agency:  [place] Arranged:  RN, IV Antibiotics HH Agency:  [place]  Status of Service:  Completed, signed off  Medicare Important Message Given:    Date Medicare IM Given:    Medicare IM give by:    Date Additional Medicare IM Given:    Additional Medicare Important Message give by:     If discussed at [place] of Stay Meetings, dates discussed:    Additional Comments: 12:12 Cm received call from MD changing dosage of VANC. CM refaxed new orders with note to disregard previously faxed orders.  No other CM needs were communicated. CM met with pt and sig. Other in room to confirm plans for [facility] to [facility] this evening at home. Address and contact information verified by pt.  CM faxed the prescriptions for [facility] and Rocephin to [place].  No other CM needs were communicated. Dellie Catholic, RN 11/22/2014, 11:59 AM

## 2014-11-25 ENCOUNTER — Telehealth: Payer: Self-pay | Admitting: Family Medicine

## 2014-11-25 LAB — CULTURE, BLOOD (ROUTINE X 2)
CULTURE: NO GROWTH
CULTURE: NO GROWTH

## 2014-11-25 NOTE — Telephone Encounter (Signed)
Spoke with patient, he is doing better, less pain constantly across his shoulders, still has pain in neck with certain movements. His creatinine is back to normal. He has an appt with Dr Carlota Raspberry in July. Picc line in place no e.o infection per patient, no fevers or chills.

## 2014-11-26 LAB — CULTURE, BLOOD (ROUTINE X 2)
CULTURE: NO GROWTH
Culture: NO GROWTH

## 2014-12-21 ENCOUNTER — Ambulatory Visit (INDEPENDENT_AMBULATORY_CARE_PROVIDER_SITE_OTHER): Payer: Commercial Managed Care - PPO | Admitting: Family Medicine

## 2014-12-21 ENCOUNTER — Encounter: Payer: Self-pay | Admitting: Family Medicine

## 2014-12-21 VITALS — BP 106/70 | HR 60 | Temp 98.4°F | Resp 16 | Ht 69.0 in | Wt 142.8 lb

## 2014-12-21 DIAGNOSIS — N183 Chronic kidney disease, stage 3 (moderate): Secondary | ICD-10-CM | POA: Diagnosis not present

## 2014-12-21 DIAGNOSIS — R350 Frequency of micturition: Secondary | ICD-10-CM

## 2014-12-21 DIAGNOSIS — Z79899 Other long term (current) drug therapy: Secondary | ICD-10-CM | POA: Diagnosis not present

## 2014-12-21 DIAGNOSIS — M4622 Osteomyelitis of vertebra, cervical region: Secondary | ICD-10-CM

## 2014-12-21 NOTE — Patient Instructions (Signed)
Follow up with Dr. Baxter Flattery next week as planned, and with me in next 2-3 months. We can recheck some bloodwork at that time if needed.   I will refer you to urologist as discussed. Your prostate test and urine culture (for infection) were ok last visit.  Return to the clinic or go to the nearest emergency room if any of your symptoms worsen or new symptoms occur.

## 2014-12-21 NOTE — Progress Notes (Signed)
Subjective:    Patient ID: Jacob Logan, male    DOB: 01/07/1951, 64 y.o.   MRN: [id number] This chart was scribed for [name], MD by Zola Button, Medical Scribe. This patient was seen in Room 21 and the patient's care was started at 1:10 PM.    HPI HPI Comments: Jacob Logan is a 64 y.o. male with a history of osteomyelitis of cervical spine and uretolithiasis who presents to the [facility] for a hospitalization follow-up. Patient was seen in the ED 1 month ago on 11/18/14 for osteomyelitis of cervical spine. See details of hospitalization prostate follow-up. Has a decline for IV antibiotics. Denies recent fever, neck pain has significantly improved, no new arthralgias. Tolerating current antibiotics. And has follow-up with infectious disease shortly.  History of urinary frequency/urgency for some time. See last visit- urine culture and PSA were okay. We'll refer to urology for evaluation.   Patient Active Problem List   Diagnosis Date Noted  . CKD (chronic kidney disease), stage III 11/22/2014  . Renal stone 11/19/2014  . Malnutrition of moderate degree 11/19/2014  . Discitis of cervical region 11/18/2014  . Osteomyelitis of cervical spine 11/18/2014  . Ureterolithiasis 10/19/2014  . Scoliosis (and kyphoscoliosis), idiopathic 10/18/2014   Past Medical History  Diagnosis Date  . Kidney stone   . Anemia   . Osteomyelitis of cervical spine 11/2014  . Malnourished    Past Surgical History  Procedure Laterality Date  . Tonsillectomy    . Kidney stone surgery    . Tonsillectomy     No Known Allergies Prior to Admission medications   Medication Sig Start Date End Date Taking? Authorizing Provider  cefTRIAXone (ROCEPHIN) 2 G SOLR injection Inject 2 g into the vein daily. 11/22/14  Yes Verlee Monte, MD  Vancomycin (VANCOCIN) 750 MG/150ML SOLN Inject 150 mLs (750 mg total) into the vein every 12 (twelve) hours. 11/22/14  Yes Verlee Monte, MD    HYDROcodone-acetaminophen (NORCO/VICODIN) 5-325 MG per tablet Take 1 tablet by mouth every 6 (six) hours as needed for moderate pain. Patient not taking: Reported on 12/21/2014 11/17/14   [name], MD  IRON PO Take 1 tablet by mouth daily.    Historical Provider, MD  tamsulosin (FLOMAX) 0.4 MG CAPS capsule Take 1 capsule (0.4 mg total) by mouth daily. Patient not taking: Reported on 12/21/2014 11/03/14   Glenford Bayley, DO   History   Social History  . Marital Status: Single    Spouse Name: N/A  . Number of Children: N/A  . Years of Education: N/A   Occupational History  . Not on file.   Social History Main Topics  . Smoking status: Never Smoker   . Smokeless tobacco: Never Used  . Alcohol Use: 0.5 oz/week    1 Standard drinks or equivalent per week  . Drug Use: No  . Sexual Activity: No   Other Topics Concern  . Not on file   Social History Narrative     Review of Systems     Objective:   Physical Exam  Constitutional: He is oriented to person, place, and time. He appears well-developed and well-nourished. No distress.  HENT:  Head: Normocephalic and atraumatic.  Mouth/Throat: Oropharynx is clear and moist. No oropharyngeal exudate.  Eyes: Pupils are equal, round, and reactive to light.  Neck: Neck supple. No spinous process tenderness present.  No midline bony tenderness. Full flexion. Decreased extension, lateral rotation and lateroflexion bilaterally, non-tender.   Cardiovascular:  Normal rate.   Negative Homans.  Pulmonary/Chest: Effort normal.  Abdominal: Soft. There is no tenderness. There is no CVA tenderness.  Musculoskeletal: He exhibits edema.  Trace pedal edema without calf tenderness.  Neurological: He is alert and oriented to person, place, and time. No cranial nerve deficit.  NVI distally.  Skin: Skin is warm and dry. No rash noted. No erythema.  PICC line in place on the right arm without apparent erythema or induration.  Psychiatric: He has a  normal mood and affect. His behavior is normal.  Vitals reviewed.     Filed Vitals:   12/21/14 1246  BP: 106/70  Pulse: 60  Temp: 98.4 F (36.9 C)  TempSrc: Oral  Resp: 16  Height: 5\' 9"  (1.753 m)  Weight: 142 lb 12.8 oz (64.774 kg)  SpO2: 97%       Assessment & Plan:   Lyndell Allaire is a 64 y.o. male Urinary frequency - Plan: Ambulatory referral to Urology  -Prior PSA and urine culture overall okay. We'll refer to urology for further evaluation.  Osteomyelitis of cervical spine  -Improved, continued on IV antibiotics through PICC line. Has follow-up planned shortly with ID, no change in regimen.  CKD (chronic kidney disease), stage 3 (moderate)  -Continue to monitor creatinine for changes, avoid nephrotoxins and maintain hydration. Plan on follow-up for repeat BMP in 2-3 months. If changes, consider evaluation with nephrologist.  High risk medication use  -IV antibiotics as above, has follow-up with infectious disease, and plan on coming off of IV antibiotics shortly.  Return to clinic precautions of fever or any increasing pain in neck or other clinical worsening.  No orders of the defined types were placed in this encounter.   Patient Instructions  Follow up with Dr. Baxter Flattery next week as planned, and with me in next 2-3 months. We can recheck some bloodwork at that time if needed.   I will refer you to urologist as discussed. Your prostate test and urine culture (for infection) were ok last visit.  Return to the clinic or go to the nearest emergency room if any of your symptoms worsen or new symptoms occur.     I personally performed the services described in this documentation, which was scribed in my presence. The recorded information has been reviewed and considered, and addended by me as needed.

## 2014-12-22 ENCOUNTER — Telehealth: Payer: Self-pay | Admitting: *Deleted

## 2014-12-22 ENCOUNTER — Other Ambulatory Visit: Payer: Self-pay | Admitting: *Deleted

## 2014-12-22 DIAGNOSIS — R110 Nausea: Secondary | ICD-10-CM

## 2014-12-22 DIAGNOSIS — M4622 Osteomyelitis of vertebra, cervical region: Secondary | ICD-10-CM

## 2014-12-22 MED ORDER — ONDANSETRON HCL 4 MG PO TABS: 4.0000 mg | ORAL_TABLET | Freq: Two times a day (BID) | ORAL | Status: DC | PRN

## 2014-12-22 MED ORDER — DOXYCYCLINE HYCLATE 100 MG PO TABS: 100.0000 mg | ORAL_TABLET | Freq: Two times a day (BID) | ORAL | Status: DC

## 2014-12-22 NOTE — Telephone Encounter (Signed)
Luanne with [facility] called to advised that the patient PICC has been pulled out a couple of inches and is just dangling in his arm. She advised she is not able to use for blood and the patient is afraid to infuse as well. She advised his last Vanc trough was 15.6 and that he has been therapeutic through his therapy. She also advised he is due to be done 12/27/14 and to see the doctor 12/28/14 and wonders if we should just pull the picc and switch to oral rather that incur the cost replacing the line.   Called Dr Baxter Flattery and advised of the situatiion and she advised to have her pull the PICC. She gave a verbal for oral Doxy 100 mg 2x daily # 60 with 1 refill, with food as well as Zofran 4 mg q12h, prn, #20 for nausea. Verified and read back.   Called Luanne back and advised her of the orders and she advised she will schedule to see the patient 12/23/14 to D/C the PICC and advise the patient of the oral medication and to take with food and remind of his appt 12/28/14 here.

## 2014-12-25 ENCOUNTER — Encounter: Payer: Self-pay | Admitting: Family Medicine

## 2014-12-28 ENCOUNTER — Ambulatory Visit (INDEPENDENT_AMBULATORY_CARE_PROVIDER_SITE_OTHER): Payer: Commercial Managed Care - PPO | Admitting: Internal Medicine

## 2014-12-28 VITALS — BP 108/69 | HR 57 | Temp 97.7°F | Resp 14 | Ht 70.0 in | Wt 141.0 lb

## 2014-12-28 DIAGNOSIS — M4642 Discitis, unspecified, cervical region: Secondary | ICD-10-CM

## 2014-12-28 LAB — CBC WITH DIFFERENTIAL/PLATELET
Basophils Absolute: 0 K/uL (ref 0.0–0.1)
Basophils Relative: 1 % (ref 0–1)
Eosinophils Absolute: 0.1 K/uL (ref 0.0–0.7)
Eosinophils Relative: 2 % (ref 0–5)
HCT: 36.5 % — ABNORMAL LOW (ref 39.0–52.0)
Hemoglobin: 12.1 g/dL — ABNORMAL LOW (ref 13.0–17.0)
Lymphocytes Relative: 22 % (ref 12–46)
Lymphs Abs: 1.1 K/uL (ref 0.7–4.0)
MCH: 28.2 pg (ref 26.0–34.0)
MCHC: 33.2 g/dL (ref 30.0–36.0)
MCV: 85.1 fL (ref 78.0–100.0)
MPV: 9.8 fL (ref 8.6–12.4)
Monocytes Absolute: 0.4 K/uL (ref 0.1–1.0)
Monocytes Relative: 9 % (ref 3–12)
Neutro Abs: 3.2 K/uL (ref 1.7–7.7)
Neutrophils Relative %: 66 % (ref 43–77)
Platelets: 221 K/uL (ref 150–400)
RBC: 4.29 MIL/uL (ref 4.22–5.81)
RDW: 15.8 % — ABNORMAL HIGH (ref 11.5–15.5)
WBC: 4.9 K/uL (ref 4.0–10.5)

## 2014-12-28 LAB — BASIC METABOLIC PANEL WITH GFR
BUN: 21 mg/dL (ref 6–23)
CO2: 26 meq/L (ref 19–32)
Calcium: 9.1 mg/dL (ref 8.4–10.5)
Chloride: 103 meq/L (ref 96–112)
Creat: 1.06 mg/dL (ref 0.50–1.35)
Glucose, Bld: 88 mg/dL (ref 70–99)
Potassium: 4.3 meq/L (ref 3.5–5.3)
Sodium: 140 meq/L (ref 135–145)

## 2014-12-28 LAB — C-REACTIVE PROTEIN

## 2014-12-28 NOTE — Progress Notes (Signed)
Subjective:    Patient ID: Jacob Logan, male    DOB: 09-02-1950, 64 y.o.   MRN: [id number]  HPI 64 yo M with recent diagnosis of C5-C5 discitis/osteomyelitis. He was hospitalized in early June for progressive neck pain. MRI imaging confirmed discitis. He was treated with vancomycin plus ceftriaxone for 5 wks 2 days, just shy of 6 wk due to picc line migrating out of his arm. He was transitioned to oral doxycycline. He has tolerated his antibiotics without difficulty. He does have baseline multiple stools, irregular bowel movements and feels that they are no different during time receiving antibiotics. He has anywhere from 1-5 loose bowel movements per day. Not associated with any blood or abdominal cramping  No fever, chils, nightsweats, no longer has any neck pain, no shoulder pain, no weakness to arms or legs, no rash, no candidal infection   MRI: June: 1. 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 2. Up to 5 mm of pathologic prevertebral soft tissue swelling consistent with infection/inflammation. 3. 3 mm of ventral dural soft tissue enhancement compatible with infection or venous engorgement. 4. Mild right foraminal narrowing at C6-7.    No Known Allergies Current Outpatient Prescriptions on File Prior to Visit  Medication Sig Dispense Refill  . doxycycline (VIBRA-TABS) 100 MG tablet Take 1 tablet (100 mg total) by mouth 2 (two) times daily. 60 tablet 1  . IRON PO Take 1 tablet by mouth daily.    . ondansetron (ZOFRAN) 4 MG tablet Take 1 tablet (4 mg total) by mouth every 12 (twelve) hours as needed for nausea or vomiting. 20 tablet 0  . cefTRIAXone (ROCEPHIN) 2 G SOLR injection Inject 2 g into the vein daily. (Patient not taking: Reported on 12/28/2014) 38 each 0  . HYDROcodone-acetaminophen (NORCO/VICODIN) 5-325 MG per tablet Take 1 tablet by mouth every 6 (six) hours as needed for  moderate pain. (Patient not taking: Reported on 12/21/2014) 15 tablet 0  . tamsulosin (FLOMAX) 0.4 MG CAPS capsule Take 1 capsule (0.4 mg total) by mouth daily. (Patient not taking: Reported on 12/21/2014) 30 capsule 3   No current facility-administered medications on file prior to visit.   Active Ambulatory Problems    Diagnosis Date Noted  . Scoliosis (and kyphoscoliosis), idiopathic 10/18/2014  . Ureterolithiasis 10/19/2014  . Discitis of cervical region 11/18/2014  . Osteomyelitis of cervical spine 11/18/2014  . Renal stone 11/19/2014  . Malnutrition of moderate degree 11/19/2014  . CKD (chronic kidney disease), stage III 11/22/2014   Resolved Ambulatory Problems    Diagnosis Date Noted  . No Resolved Ambulatory Problems   Past Medical History  Diagnosis Date  . Kidney stone   . Anemia   . Malnourished       Review of Systems + diarrhea, otherwise 10 point ros is negative    Objective:   Physical Exam BP 108/69 mmHg  Pulse 57  Temp(Src) 97.7 F (36.5 C) (Oral)  Resp 14  Ht 5\' 10"  (1.778 m)  Wt 141 lb (63.957 kg)  BMI 20.23 kg/m2  Physical Exam  Constitutional: He is oriented to person, place, and time. He appears well-developed and well-nourished. No distress.  HENT:  Mouth/Throat: Oropharynx is clear and moist. No oropharyngeal exudate.  Cardiovascular: Normal rate, regular rhythm and normal heart sounds. Exam reveals no gallop and no friction rub.  No murmur heard.  Pulmonary/Chest: Effort normal and breath sounds normal. No respiratory distress. He  has no wheezes.  Abdominal: Soft. Bowel sounds are normal. He exhibits no distension. There is no tenderness.  Lymphadenopathy:  He has no cervical adenopathy.  Neurological: He is alert and oriented to person, place, and time.  Skin: Skin is warm and dry. No rash noted. No erythema.  Psychiatric: He has a normal mood and affect. His behavior is normal.         Assessment & Plan:  Cervical spine discitis =  continue with doxycycline for a total of 4 wks, currently on day 6 of 30 of oral antibiotics. He has just finished 5 1/2 wk of iv antibiotics. Since he is no longer having symptoms, not compelled to do repeat imaging. We will check inflammatory markers.

## 2014-12-29 LAB — SEDIMENTATION RATE: Sed Rate: 6 mm/hr (ref 0–20)

## 2015-01-19 ENCOUNTER — Ambulatory Visit (INDEPENDENT_AMBULATORY_CARE_PROVIDER_SITE_OTHER): Payer: Commercial Managed Care - PPO | Admitting: Internal Medicine

## 2015-01-19 ENCOUNTER — Encounter: Payer: Self-pay | Admitting: Internal Medicine

## 2015-01-19 VITALS — BP 123/81 | HR 58 | Temp 97.6°F | Wt 144.0 lb

## 2015-01-19 DIAGNOSIS — M4642 Discitis, unspecified, cervical region: Secondary | ICD-10-CM | POA: Diagnosis not present

## 2015-01-19 NOTE — Progress Notes (Signed)
Rfv: hospital follow up for cervical discitis treatment Subjective:    Patient ID: Jacob Logan, male    DOB: May 29, 1951, 64 y.o.   MRN: [id number]  HPI  64 yo M with recent diagnosis of C5-C5 discitis/osteomyelitis. He was hospitalized in early June for progressive neck pain. MRI imaging confirmed discitis. He was treated with vancomycin plus ceftriaxone  just shy of 6 wk due to picc line migrating out of his arm. He was transitioned to oral doxycycline since mid July. He has tolerated his antibiotics without difficulty. He denies any further neck pain, no arm weakness or leg weakness, no bowel or bladder dysfunction. He does notice some bladder discomfort only when he runs.   He is back to his baseline weight.  No Known Allergies Current Outpatient Prescriptions on File Prior to Visit  Medication Sig Dispense Refill  . doxycycline (VIBRA-TABS) 100 MG tablet Take 1 tablet (100 mg total) by mouth 2 (two) times daily. 60 tablet 1  . cefTRIAXone (ROCEPHIN) 2 G SOLR injection Inject 2 g into the vein daily. (Patient not taking: Reported on 12/28/2014) 38 each 0  . HYDROcodone-acetaminophen (NORCO/VICODIN) 5-325 MG per tablet Take 1 tablet by mouth every 6 (six) hours as needed for moderate pain. (Patient not taking: Reported on 12/21/2014) 15 tablet 0  . IRON PO Take 1 tablet by mouth daily.    . ondansetron (ZOFRAN) 4 MG tablet Take 1 tablet (4 mg total) by mouth every 12 (twelve) hours as needed for nausea or vomiting. (Patient not taking: Reported on 01/19/2015) 20 tablet 0  . tamsulosin (FLOMAX) 0.4 MG CAPS capsule Take 1 capsule (0.4 mg total) by mouth daily. (Patient not taking: Reported on 01/19/2015) 30 capsule 3   No current facility-administered medications on file prior to visit.   Active Ambulatory Problems    Diagnosis Date Noted  . Scoliosis (and kyphoscoliosis), idiopathic 10/18/2014  . Ureterolithiasis 10/19/2014  . Discitis of cervical region 11/18/2014  . Osteomyelitis of  cervical spine 11/18/2014  . Renal stone 11/19/2014  . Malnutrition of moderate degree 11/19/2014  . CKD (chronic kidney disease), stage III 11/22/2014   Resolved Ambulatory Problems    Diagnosis Date Noted  . No Resolved Ambulatory Problems   Past Medical History  Diagnosis Date  . Kidney stone   . Anemia   . Malnourished    History  Substance Use Topics  . Smoking status: Never Smoker   . Smokeless tobacco: Never Used  . Alcohol Use: 0.5 oz/week    1 Standard drinks or equivalent per week   family history includes Alzheimer's disease in his brother; Cancer in his mother; Cataracts in his maternal grandmother; Diabetes in his brother, father, and mother; Heart disease in his brother, father, and mother; Hyperlipidemia in his brother, father, and mother; Hypertension in his brother, father, and mother; Obesity in his brother, father, and mother; Stroke in his brother; Uterine cancer in his mother.   Review of Systems 10 point ros is reviewed other than what is mentioned in hpi    Objective:   Physical Exam  BP 123/81 mmHg  Pulse 58  Temp(Src) 97.6 F (36.4 C) (Oral)  Wt 144 lb (65.318 kg)  gen = a x o by 3 in NAD Neck = full range of motion without any discomfort HEENT = no signs of oral pharyngeal erythema, no thrush, MMM Skin = no signs of rash  Lab Results  Component Value Date   ESRSEDRATE 6 12/28/2014   Lab Results  Component  Value Date   CRP <0.5 12/28/2014       Assessment & Plan:   cervical discitis = he has finished course of prolonged antibiotics. His inflammatory markers have returned to baseline and he no longer has symptoms concerning for discitis. We will have him come back as needed. For now he has finished a total of 10 wk of antibiotics (Iv plus oral) for his discitis  Bladder irritation while running = he denies any dysuria presently. Recommend he follows up with his pcp if it continues.  rtc prn

## 2015-03-22 ENCOUNTER — Ambulatory Visit: Payer: Commercial Managed Care - PPO | Admitting: Family Medicine

## 2015-03-29 ENCOUNTER — Encounter: Payer: Self-pay | Admitting: Family Medicine

## 2015-03-29 ENCOUNTER — Ambulatory Visit (INDEPENDENT_AMBULATORY_CARE_PROVIDER_SITE_OTHER): Payer: Commercial Managed Care - PPO | Admitting: Family Medicine

## 2015-03-29 VITALS — BP 94/63 | HR 59 | Temp 97.5°F | Resp 16 | Ht 70.0 in | Wt 144.0 lb

## 2015-03-29 DIAGNOSIS — Z23 Encounter for immunization: Secondary | ICD-10-CM | POA: Diagnosis not present

## 2015-03-29 DIAGNOSIS — R350 Frequency of micturition: Secondary | ICD-10-CM | POA: Diagnosis not present

## 2015-03-29 DIAGNOSIS — R1030 Lower abdominal pain, unspecified: Secondary | ICD-10-CM | POA: Diagnosis not present

## 2015-03-29 DIAGNOSIS — R748 Abnormal levels of other serum enzymes: Secondary | ICD-10-CM | POA: Diagnosis not present

## 2015-03-29 DIAGNOSIS — R0609 Other forms of dyspnea: Secondary | ICD-10-CM | POA: Diagnosis not present

## 2015-03-29 DIAGNOSIS — R7989 Other specified abnormal findings of blood chemistry: Secondary | ICD-10-CM

## 2015-03-29 DIAGNOSIS — D649 Anemia, unspecified: Secondary | ICD-10-CM | POA: Diagnosis not present

## 2015-03-29 DIAGNOSIS — Z8249 Family history of ischemic heart disease and other diseases of the circulatory system: Secondary | ICD-10-CM | POA: Diagnosis not present

## 2015-03-29 LAB — CBC
HCT: 39.3 % (ref 39.0–52.0)
HEMOGLOBIN: 14 g/dL (ref 13.0–17.0)
MCH: 29.5 pg (ref 26.0–34.0)
MCHC: 35.6 g/dL (ref 30.0–36.0)
MCV: 82.9 fL (ref 78.0–100.0)
MPV: 10 fL (ref 8.6–12.4)
PLATELETS: 159 10*3/uL (ref 150–400)
RBC: 4.74 MIL/uL (ref 4.22–5.81)
RDW: 13.4 % (ref 11.5–15.5)
WBC: 4.3 10*3/uL (ref 4.0–10.5)

## 2015-03-29 LAB — POCT URINALYSIS DIP (MANUAL ENTRY)
Bilirubin, UA: NEGATIVE
GLUCOSE UA: NEGATIVE
Ketones, POC UA: NEGATIVE
NITRITE UA: NEGATIVE
PROTEIN UA: NEGATIVE
RBC UA: NEGATIVE
Spec Grav, UA: 1.015
Urobilinogen, UA: 0.2
pH, UA: 5.5

## 2015-03-29 LAB — POC MICROSCOPIC URINALYSIS (UMFC): Mucus: ABSENT

## 2015-03-29 LAB — BASIC METABOLIC PANEL
BUN: 17 mg/dL (ref 7–25)
CALCIUM: 9.1 mg/dL (ref 8.6–10.3)
CHLORIDE: 101 mmol/L (ref 98–110)
CO2: 29 mmol/L (ref 20–31)
Creat: 1.03 mg/dL (ref 0.70–1.25)
Glucose, Bld: 94 mg/dL (ref 65–99)
POTASSIUM: 4.4 mmol/L (ref 3.5–5.3)
SODIUM: 139 mmol/L (ref 135–146)

## 2015-03-29 LAB — GLUCOSE, POCT (MANUAL RESULT ENTRY): POC Glucose: 101 mg/dl — AB (ref 70–99)

## 2015-03-29 MED ORDER — ZOSTER VACCINE LIVE 19400 UNT/0.65ML ~~LOC~~ SOLR: 0.6500 mL | Freq: Once | SUBCUTANEOUS | Status: DC

## 2015-03-29 NOTE — Patient Instructions (Addendum)
I am concerned that your shortness of breath with exertion could be related to heart disease - especially with your family history. I will refer you to a cardiologist for evaluation.  Avoid exertion activities until you are evaluated by cardiology. Return to the clinic or go to the nearest emergency room if any of your symptoms worsen or new symptoms occur.  I recommend follow up with urologist as discussed last visit for your urinary symptoms. Let me know if I need to refer you again.   You should receive a call or letter about your lab results within the next week to 10 days.  Return to the clinic or go to the nearest emergency room if any of your symptoms worsen or new symptoms occur.  Urinary Frequency The number of times a normal person urinates depends upon how much liquid they take in and how much liquid they are losing. If the temperature is hot and there is high humidity, then the person will sweat more and usually breathe a little more frequently. These factors decrease the amount of frequency of urination that would be considered normal. The amount you drink is easily determined, but the amount of fluid lost is sometimes more difficult to calculate.  Fluid is lost in two ways:  Sensible fluid loss is usually measured by the amount of urine that you get rid of. Losses of fluid can also occur with diarrhea.  Insensible fluid loss is more difficult to measure. It is caused by evaporation. Insensible loss of fluid occurs through breathing and sweating. It usually ranges from a little less than a quart to a little more than a quart of fluid a day. In normal temperatures and activity levels, the average person may urinate 4 to 7 times in a 24-hour period. Needing to urinate more often than that could indicate a problem. If one urinates 4 to 7 times in 24 hours and has large volumes each time, that could indicate a different problem from one who urinates 4 to 7 times a day and has small volumes.  The time of urinating is also important. Most urinating should be done during the waking hours. Getting up at night to urinate frequently can indicate some problems. CAUSES  The bladder is the organ in your lower abdomen that holds urine. Like a balloon, it swells some as it fills up. Your nerves sense this and tell you it is time to head for the bathroom. There are a number of reasons that you might feel the need to urinate more often than usual. They include:  Urinary tract infection. This is usually associated with other signs such as burning when you urinate.  In men, problems with the prostate (a walnut-size gland that is located near the tube that carries urine out of your body). There are two reasons why the prostate can cause an increased frequency of urination:  An enlarged prostate that does not let the bladder empty well. If the bladder only half empties when you urinate, then it only has half the capacity to fill before you have to urinate again.  The nerves in the bladder become more hypersensitive with an increased size of the prostate even if the bladder empties completely.  Pregnancy.  Obesity. Excess weight is more likely to cause a problem for women than for men.  Bladder stones or other bladder problems.  Caffeine.  Alcohol.  Medications. For example, drugs that help the body get rid of extra fluid (diuretics) increase urine production. Some  other medicines must be taken with lots of fluids.  Muscle or nerve weakness. This might be the result of a spinal cord injury, a stroke, multiple sclerosis, or Parkinson disease.  Long-standing diabetes can decrease the sensation of the bladder. This loss of sensation makes it harder to sense the bladder needs to be emptied. Over a period of years, the bladder is stretched out by constant overfilling. This weakens the bladder muscles so that the bladder does not empty well and has less capacity to fill with new urine.  Interstitial  cystitis (also called painful bladder syndrome). This condition develops because the tissues that line the inside of the bladder are inflamed (inflammation is the body's way of reacting to injury or infection). It causes pain and frequent urination. It occurs in women more often than in men. DIAGNOSIS   To decide what might be causing your urinary frequency, your health care provider will probably:  Ask about symptoms you have noticed.  Ask about your overall health. This will include questions about any medications you are taking.  Do a physical examination.  Order some tests. These might include:  A blood test to check for diabetes or other health issues that could be contributing to the problem.  Urine testing. This could measure the flow of urine and the pressure on the bladder.  A test of your neurological system (the brain, spinal cord, and nerves). This is the system that senses the need to urinate.  A bladder test to check whether it is emptying completely when you urinate.  Cystoscopy. This test uses a thin tube with a tiny camera on it. It offers a look inside your urethra and bladder to see if there are problems.  Imaging tests. You might be given a contrast dye and then asked to urinate. X-rays are taken to see how your bladder is working. TREATMENT  It is important for you to be evaluated to determine if the amount or frequency that you have is unusual or abnormal. If it is found to be abnormal, the cause should be determined and this can usually be found out easily. Depending upon the cause, treatment could include medication, stimulation of the nerves, or surgery. There are not too many things that you can do as an individual to change your urinary frequency. It is important that you balance the amount of fluid intake needed to compensate for your activity and the temperature. Medical problems will be diagnosed and taken care of by your physician. There is no particular  bladder training such as Kegel exercises that you can do to help urinary frequency. This is an exercise that is usually recommended for people who have leaking of urine when they laugh, cough, or sneeze. HOME CARE INSTRUCTIONS   Take any medications your health care provider prescribed or suggested. Follow the directions carefully.  Practice any lifestyle changes that are recommended. These might include:  Drinking less fluid or drinking at different times of the day. If you need to urinate often during the night, for example, you may need to stop drinking fluids early in the evening.  Cutting down on caffeine or alcohol. They both can make you need to urinate more often than normal. Caffeine is found in coffee, tea, and sodas.  Losing weight, if that is recommended.  Keep a journal or a log. You might be asked to record how much you drink and when and where you feel the need to urinate. This will also help evaluate how well  the treatment provided by your physician is working. SEEK MEDICAL CARE IF:   Your need to urinate often gets worse.  You feel increased pain or irritation when you urinate.  You notice blood in your urine.  You have questions about any medications that your health care provider recommended.  You notice blood, pus, or swelling at the site of any test or treatment procedure.  You develop a fever of more than 100.76F (38.1C). SEEK IMMEDIATE MEDICAL CARE IF:  You develop a fever of more than 102.8F (38.9C).   This information is not intended to replace advice given to you by your health care provider. Make sure you discuss any questions you have with your health care provider.   Document Released: 03/25/2009 Document Revised: 06/19/2014 Document Reviewed: 03/25/2009 Elsevier Interactive Patient Education [employer].

## 2015-03-29 NOTE — Progress Notes (Addendum)
Subjective:  This chart was scribed for [name], MD by Moises Blood, Medical Scribe. This patient was seen in room 24 and the patient's care was started 9:39 AM.    Patient ID: Jacob Logan, male    DOB: 11-13-1950, 64 y.o.   MRN: [id number]  HPI Jacob Logan is a 65 y.o. male Here for follow up. Last seen by me July 11th for hospital follow up for osteomyelitis for cervical spine. He received IV antibiotics and treated by infectious disease. 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   Cervical discitis He received 10 weeks of antibiotics and most recent ID eval Aug 9th with resolution of symptoms. No further neck pain.   Urinary frequency See last visit. PSA normal in June. Negative urine culture in June was referred to urology. He didn't see urologist due to cost. He still has some frequency, going every 2 hours.   Chronic kidney disease  Lab Results  Component Value Date   CREATININE 1.06 12/28/2014  Creatinine high of 1.43, but normalized in visit in July.   Anemia Lab Results  Component Value Date   WBC 4.9 12/28/2014   HGB 12.1* 12/28/2014   HCT 36.5* 12/28/2014   MCV 85.1 12/28/2014   PLT 221 12/28/2014   Low of 9.8 while in hospital. He eats 2-3 meals a day and not many snacks. He walks to and from work, which is about 5 miles. He started running again and noticed some discomfort in his suprapubic region which started in July. This pain is not reproduced by walking.   He also notes having some shortness of breath with the running, noticed 1-2 years ago. 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 He feels he can't get enough breath. He denies chest pain. His father, who smoked, had heart issues, starting around 63 years old and had 4 heart attacks. He had a stress test done a long time ago, back 30 years ago. He denies any smoking.   Wt Readings from Last 3 Encounters:  03/29/15 144 lb (65.318 kg)  01/19/15 144 lb  (65.318 kg)  12/28/14 141 lb (63.957 kg)   Immunizations He is due for shingles.   Family He denies having any children. He has an older brother by 30 years.    Patient Active Problem List   Diagnosis Date Noted  . CKD (chronic kidney disease), stage III 11/22/2014  . Renal stone 11/19/2014  . Malnutrition of moderate degree ([place]) 11/19/2014  . Discitis of cervical region 11/18/2014  . 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 Ureterolithiasis 10/19/2014  . Scoliosis (and kyphoscoliosis), idiopathic 10/18/2014   Past Medical History  Diagnosis Date  . Kidney stone   . Anemia   . Osteomyelitis of cervical spine ([place]) 11/2014  . Malnourished [facility])    Past Surgical History  Procedure Laterality Date  . Tonsillectomy    . Kidney stone surgery    . Tonsillectomy     No Known Allergies Prior to Admission medications   Medication Sig Start Date End Date Taking? Authorizing Provider  cefTRIAXone (ROCEPHIN) 2 G SOLR injection Inject 2 g into the vein daily. Patient not taking: Reported on 12/28/2014 11/22/14   Verlee Monte, MD  doxycycline (VIBRA-TABS) 100 MG tablet Take 1 tablet (100 mg total) by mouth 2 (two) times daily. 12/22/14   Carlyle Basques, MD  HYDROcodone-acetaminophen (NORCO/VICODIN) 5-325 MG per tablet Take 1 tablet by mouth every 6 (  six) hours as needed for moderate pain. Patient not taking: Reported on 12/21/2014 11/17/14   [name], MD  IRON PO Take 1 tablet by mouth daily.    Historical Provider, MD  ondansetron (ZOFRAN) 4 MG tablet Take 1 tablet (4 mg total) by mouth every 12 (twelve) hours as needed for nausea or vomiting. Patient not taking: Reported on 01/19/2015 12/22/14   Carlyle Basques, MD  tamsulosin (FLOMAX) 0.4 MG CAPS capsule Take 1 capsule (0.4 mg total) by mouth daily. Patient not taking: Reported on 01/19/2015 11/03/14   Glenford Bayley, DO   Social History   Social History  . Marital Status: Single    Spouse Name: N/A  . Number of Children:  N/A  . Years of Education: N/A   Occupational History  . Not on file.   Social History Main Topics  . Smoking status: Never Smoker   . Smokeless tobacco: Never Used  . Alcohol Use: 0.5 oz/week    1 Standard drinks or equivalent per week  . Drug Use: No  . Sexual Activity: No   Other Topics Concern  . Not on file   Social History Narrative    Review of Systems  Respiratory: Positive for shortness of breath (upon exertion) and wheezing.   Cardiovascular: Negative for chest pain.  Gastrointestinal: Positive for abdominal pain (suprapubic).  Genitourinary: Positive for frequency.  Musculoskeletal: Negative for neck pain.       Objective:   Physical Exam  Constitutional: He is oriented to person, place, and time. He appears well-developed and well-nourished.  HENT:  Head: Normocephalic and atraumatic.  Eyes: EOM are normal. Pupils are equal, round, and reactive to light.  Neck: No JVD present. Carotid bruit is not present.  Cardiovascular: Normal rate, regular rhythm and normal heart sounds.   No murmur heard. Pulmonary/Chest: Effort normal and breath sounds normal. He has no rales.  Abdominal: There is no tenderness.  Locates discomfort in suprapubic area, but nontender  Musculoskeletal: He exhibits no edema.  Neurological: He is alert and oriented to person, place, and time.  Skin: Skin is warm and dry.  Psychiatric: He has a normal mood and affect.  Vitals reviewed.   Filed Vitals:   03/29/15 0844  BP: 94/63  Pulse: 59  Temp: 97.5 F (36.4 C)  Resp: 16  Height: 5\' 10"  (1.778 m)  Weight: 144 lb (65.318 kg)   EKG: sinus bradycardia, rate 47; no acute findings otherwise  Results for orders placed or performed in visit on 03/29/15  POCT Microscopic Urinalysis ([facility])  Result Value Ref Range   WBC,UR,HPF,POC Few (A) None WBC/hpf   RBC,UR,HPF,POC None None RBC/hpf   Bacteria None None   Mucus Absent Absent   Epithelial Cells, UR Per Microscopy None None  cells/hpf  POCT urinalysis dipstick  Result Value Ref Range   Color, UA light yellow (A) yellow   Clarity, UA clear clear   Glucose, UA negative negative   Bilirubin, UA negative negative   Ketones, POC UA negative negative   Spec Grav, UA 1.015    Blood, UA negative negative   pH, UA 5.5    Protein Ur, POC negative negative   Urobilinogen, UA 0.2    Nitrite, UA Negative Negative   Leukocytes, UA Trace (A) Negative  POCT glucose (manual entry)  Result Value Ref Range   POC Glucose 101 (A) 70 - 99 mg/dl        Assessment & Plan:   Dewan Emond is a  64 y.o. male Need for prophylactic vaccination and inoculation against influenza - Plan: Flu Vaccine QUAD 36+ mos IM given.   Need for shingles vaccine - Plan: zoster vaccine live, PF, (ZOSTAVAX) 17793 UNT/0.65ML injection  -printed rx for his pharmacy.   Anemia, unspecified anemia type - Plan: CBC repeated.    Elevated serum creatinine - Plan: Basic metabolic panel repeated. Most recent reading ok.   Urinary frequency - Plan: POCT Microscopic Urinalysis ([facility]), POCT urinalysis dipstick, PSA, POCT glucose (manual entry), Urine culture  - similar sx's as last visit. Repeat OSA dn urine culture, but again recommended he follow up with urology. Referral was placed last visit.  Suprapubic abdominal pain, unspecified laterality  - no concerns on exam - as above, recommended urology eval when he is ready.   Dyspnea on exertion - Plan: EKG 12-Lead, Ambulatory referral to Cardiology Family history of heart disease in male family member before age 76 - Plan: EKG 12-Lead, Ambulatory referral to Cardiology  - repeat CBC to R/O worsening anemia as cause.   -concerning for possible cardiac disease given FH of early heart disease. No acute findings seen on EKG, so will refer for outpatient eval by cardiology and likely stress testing. ER/911 chest pain precautions reviewed, and advised against any exertional exercise until evaluated by  cardiology.  Meds ordered this encounter  Medications  . zoster vaccine live, PF, (ZOSTAVAX) 90300 UNT/0.65ML injection    Sig: Inject 19,400 Units into the skin once.    Dispense:  1 each    Refill:  0   Patient Instructions  I am concerned that your shortness of breath with exertion could be related to heart disease - especially with your family history. I will refer you to a cardiologist for evaluation.  Avoid exertion activities until you are evaluated by cardiology. Return to the clinic or go to the nearest emergency room if any of your symptoms worsen or new symptoms occur.  I recommend follow up with urologist as discussed last visit for your urinary symptoms. Let me know if I need to refer you again.   You should receive a call or letter about your lab results within the next week to 10 days.  Return to the clinic or go to the nearest emergency room if any of your symptoms worsen or new symptoms occur.  Urinary Frequency The number of times a normal person urinates depends upon how much liquid they take in and how much liquid they are losing. If the temperature is hot and there is high humidity, then the person will sweat more and usually breathe a little more frequently. These factors decrease the amount of frequency of urination that would be considered normal. The amount you drink is easily determined, but the amount of fluid lost is sometimes more difficult to calculate.  Fluid is lost in two ways:  Sensible fluid loss is usually measured by the amount of urine that you get rid of. Losses of fluid can also occur with diarrhea.  Insensible fluid loss is more difficult to measure. It is caused by evaporation. Insensible loss of fluid occurs through breathing and sweating. It usually ranges from a little less than a quart to a little more than a quart of fluid a day. In normal temperatures and activity levels, the average person may urinate 4 to 7 times in a 24-hour period. Needing  to urinate more often than that could indicate a problem. If one urinates 4 to 7 times in 24 hours and  has large volumes each time, that could indicate a different problem from one who urinates 4 to 7 times a day and has small volumes. The time of urinating is also important. Most urinating should be done during the waking hours. Getting up at night to urinate frequently can indicate some problems. CAUSES  The bladder is the organ in your lower abdomen that holds urine. Like a balloon, it swells some as it fills up. Your nerves sense this and tell you it is time to head for the bathroom. There are a number of reasons that you might feel the need to urinate more often than usual. They include:  Urinary tract infection. This is usually associated with other signs such as burning when you urinate.  In men, problems with the prostate (a walnut-size gland that is located near the tube that carries urine out of your body). There are two reasons why the prostate can cause an increased frequency of urination:  An enlarged prostate that does not let the bladder empty well. If the bladder only half empties when you urinate, then it only has half the capacity to fill before you have to urinate again.  The nerves in the bladder become more hypersensitive with an increased size of the prostate even if the bladder empties completely.  Pregnancy.  Obesity. Excess weight is more likely to cause a problem for women than for men.  Bladder stones or other bladder problems.  Caffeine.  Alcohol.  Medications. For example, drugs that help the body get rid of extra fluid (diuretics) increase urine production. Some other medicines must be taken with lots of fluids.  Muscle or nerve weakness. This might be the result of a spinal cord injury, a stroke, multiple sclerosis, or Parkinson disease.  Long-standing diabetes can decrease the sensation of the bladder. This loss of sensation makes it harder to sense the  bladder needs to be emptied. Over a period of years, the bladder is stretched out by constant overfilling. This weakens the bladder muscles so that the bladder does not empty well and has less capacity to fill with new urine.  Interstitial cystitis (also called painful bladder syndrome). This condition develops because the tissues that line the inside of the bladder are inflamed (inflammation is the body's way of reacting to injury or infection). It causes pain and frequent urination. It occurs in women more often than in men. DIAGNOSIS   To decide what might be causing your urinary frequency, your health care provider will probably:  Ask about symptoms you have noticed.  Ask about your overall health. This will include questions about any medications you are taking.  Do a physical examination.  Order some tests. These might include:  A blood test to check for diabetes or other health issues that could be contributing to the problem.  Urine testing. This could measure the flow of urine and the pressure on the bladder.  A test of your neurological system (the brain, spinal cord, and nerves). This is the system that senses the need to urinate.  A bladder test to check whether it is emptying completely when you urinate.  Cystoscopy. This test uses a thin tube with a tiny camera on it. It offers a look inside your urethra and bladder to see if there are problems.  Imaging tests. You might be given a contrast dye and then asked to urinate. X-rays are taken to see how your bladder is working. TREATMENT  It is important for you to be  evaluated to determine if the amount or frequency that you have is unusual or abnormal. If it is found to be abnormal, the cause should be determined and this can usually be found out easily. Depending upon the cause, treatment could include medication, stimulation of the nerves, or surgery. There are not too many things that you can do as an individual to change  your urinary frequency. It is important that you balance the amount of fluid intake needed to compensate for your activity and the temperature. Medical problems will be diagnosed and taken care of by your physician. There is no particular bladder training such as Kegel exercises that you can do to help urinary frequency. This is an exercise that is usually recommended for people who have leaking of urine when they laugh, cough, or sneeze. HOME CARE INSTRUCTIONS   Take any medications your health care provider prescribed or suggested. Follow the directions carefully.  Practice any lifestyle changes that are recommended. These might include:  Drinking less fluid or drinking at different times of the day. If you need to urinate often during the night, for example, you may need to stop drinking fluids early in the evening.  Cutting down on caffeine or alcohol. They both can make you need to urinate more often than normal. Caffeine is found in coffee, tea, and sodas.  Losing weight, if that is recommended.  Keep a journal or a log. You might be asked to record how much you drink and when and where you feel the need to urinate. This will also help evaluate how well the treatment provided by your physician is working. SEEK MEDICAL CARE IF:   Your need to urinate often gets worse.  You feel increased pain or irritation when you urinate.  You notice blood in your urine.  You have questions about any medications that your health care provider recommended.  You notice blood, pus, or swelling at the site of any test or treatment procedure.  You develop a fever of more than 100.4F (38.1C). SEEK IMMEDIATE MEDICAL CARE IF:  You develop a fever of more than 102.20F (38.9C).   This information is not intended to replace advice given to you by your health care provider. Make sure you discuss any questions you have with your health care provider.   Document Released: 03/25/2009 Document Revised:  06/19/2014 Document Reviewed: 03/25/2009 Elsevier Interactive Patient Education [employer].      By signing my name below, I, Moises Blood, attest that this documentation has been prepared under the direction and in the presence of [name], MD. Electronically Signed: Moises Blood, [place]. 03/29/2015 , 9:39 AM .  I personally performed the services described in this documentation, which was scribed in my presence. The recorded information has been reviewed and considered, and addended by me as needed.

## 2015-03-30 LAB — URINE CULTURE
COLONY COUNT: NO GROWTH
ORGANISM ID, BACTERIA: NO GROWTH

## 2015-03-30 LAB — PSA: PSA: 1.03 ng/mL (ref <=4.00)

## 2015-04-07 ENCOUNTER — Ambulatory Visit (INDEPENDENT_AMBULATORY_CARE_PROVIDER_SITE_OTHER): Payer: Commercial Managed Care - PPO | Admitting: Physician Assistant

## 2015-04-07 ENCOUNTER — Encounter: Payer: Self-pay | Admitting: Physician Assistant

## 2015-04-07 VITALS — BP 98/68 | HR 64 | Ht 70.0 in | Wt 144.8 lb

## 2015-04-07 DIAGNOSIS — R0609 Other forms of dyspnea: Secondary | ICD-10-CM

## 2015-04-07 MED ORDER — NITROGLYCERIN 0.4 MG SL SUBL: 0.4000 mg | SUBLINGUAL_TABLET | SUBLINGUAL | Status: DC | PRN

## 2015-04-07 MED ORDER — ASPIRIN EC 81 MG PO TBEC: 81.0000 mg | DELAYED_RELEASE_TABLET | Freq: Every day | ORAL | Status: DC

## 2015-04-07 NOTE — Patient Instructions (Addendum)
Medication Instructions:  Your physician has recommended you make the following change in your medication:  1.  START Aspirin 81 mg tablet taking 1 tablet daily 2.  START Nitroglycerin 0.4 tablet taking only as needed for chest pains as directed on the bottle    Labwork: FASTING LIPIDS (ON THE SAME DAY AS YOUR STRESS TEST)  Testing/Procedures: Your physician has requested that you have en exercise stress myoview. For further information please visit [email or web address]. Please follow instruction sheet, as given.    Follow-Up: Your physician recommends that you schedule a follow-up appointment in: 1-2 WEEKS (AFTER STRESS MYOVIEW) WITH RHONDA BARRETT, PA-C ([facility] OFFICE IS OK)   Any Other Special Instructions Will Be Listed Below (If Applicable).

## 2015-04-07 NOTE — Progress Notes (Signed)
Cardiology Office Note   Date:  04/07/2015   ID:  Jacob Logan, DOB 1950-11-01, MRN [id number]  PCP:  Leotis Pain, DO  Cardiologist:  Ival Bible, PA-C   Chief Complaint  Patient presents with  . Other    Dyspnea on exertion when running and stair climbing    History of Present Illness: Jacob Logan is a 64 y.o. male with a history of anemia in the setting of acute illness and frequent blood draws, cervical discitis rx w/ 10 weeks IV ABX 11/2014, urinary frequency, ARI 11/2014 w/ BUN/Cr improved to 17/1.03 10/17.   He was evaluated by his primary MD and discussed DOE.   Jacob Logan presents for evaluation of his DOE.  Jacob Logan has noticed increasing dyspnea on exertion for the last couple of years. Initially he would just feel short of breath after he is from about 5 miles. This is gradually progressed to the point that he was recently only able to run 2 or 3 blocks before he had to stop and catch his breath. He would start going again but then again have to stop after just a few blocks. This was several weeks ago and he has not tried to run since then. He has continued to walk to and from work which is about 5 miles each way. He feels short of breath with hills, but as long as he is walking on flat ground he does okay. He has not had any chest pain.  He feels that he eats well and has always been slender. He admits that he does not get that much rest because he works nights. However, after the infection in his neck, he quit both of his part-time jobs and is now only working one full-time job. He is getting a little more rest now. He notes frequent urination, but has not seen urology. He was anemic at the time of his infection, with a hemoglobin as low as 9.8. However, that has improved and was recently 12.1.   Past Medical History  Diagnosis Date  . Kidney stone   . Anemia   . Osteomyelitis of cervical spine ([place]) 11/2014  . Malnourished [facility])     Past Surgical  History  Procedure Laterality Date  . Tonsillectomy    . Kidney stone surgery    . Tonsillectomy      Current Outpatient Prescriptions  Medication Sig Dispense Refill  . IRON PO Take 1 tablet by mouth daily.     No current facility-administered medications for this visit.    Allergies:   Review of patient's allergies indicates no known allergies.    Social History:  The patient  reports that he has never smoked. He has never used smokeless tobacco. He reports that he drinks about 0.5 oz of alcohol per week. He reports that he does not use illicit drugs.   Family History:  The patient's family history includes Alzheimer's disease in his brother; Cancer in his mother; Cataracts in his maternal grandmother; Diabetes in his brother, father, and mother; Heart attack in his father; Heart disease in his brother, father, and mother; Hyperlipidemia in his brother, father, and mother; Hypertension in his brother, father, and mother; Obesity in his brother, father, and mother; Stroke in his brother; Uterine cancer in his mother.    ROS:  Please see the history of present illness. All other systems are reviewed and negative.    PHYSICAL EXAM: VS:  BP 98/68 mmHg  Pulse 64  Ht 5\' 10"  (1.778 m)  Wt 144 lb 12.8 oz (65.681 kg)  BMI 20.78 kg/m2  SpO2 98% , BMI Body mass index is 20.78 kg/(m^2). GEN: Slender, well developed, male in no acute distress HEENT: normal for age  Neck: no JVD, soft right carotid bruit, no masses Cardiac: RRR; no murmur, no rubs, or gallops Respiratory:  clear to auscultation bilaterally, normal work of breathing GI: soft, nontender, nondistended, + BS MS: no deformity or atrophy; no edema; distal pulses are 2+ in all 4 extremities  Skin: warm and dry, no rash Neuro:  Strength and sensation are intact Psych: euthymic mood, full affect   EKG:  EKG is not ordered today.  Recent Labs: 11/03/2014: TSH 2.272 11/19/2014: ALT 20 03/29/2015: BUN 17; Creat 1.03; Hemoglobin  14.0; Platelets 159; Potassium 4.4; Sodium 139    Lipid Panel No results found for: CHOL, TRIG, HDL, CHOLHDL, VLDL, LDLCALC, LDLDIRECT   Wt Readings from Last 3 Encounters:  04/07/15 144 lb 12.8 oz (65.681 kg)  03/29/15 144 lb (65.318 kg)  01/19/15 144 lb (65.318 kg)     Other studies Reviewed: Additional studies/ records that were reviewed today include: Labs, previous ECG, office notes and hospital records.  ASSESSMENT AND PLAN:  1.  Dyspnea on exertion, possible anginal equivalent: Jacob Logan has been having gradually increasing dyspnea on exertion. His symptoms have become significant when he tries to increase his exertion level. He is able to walk on flat ground without difficulty but when he tries to do more than that, either by walking up a hill or running, he cannot go very far without having to stop and catch his breath.   This is concerning for an anginal equivalent. He has no recent check of his cholesterol. He has a strong family history of premature coronary artery disease in his father. An ischemic evaluation is indicated. An exercise Myoview will be ordered. When he comes in for the stress test, a fasting lipid profile and liver function tests will be obtained.  Jacob Logan has significant financial concerns because his recent illness has strained his savings and he states he does not make very much. He is also now bringing him last in, because he is no longer working his part-time jobs. Once the test is ordered and precertified through his insurance company, he can call and find out how much his co-pay will be. If it is prohibitive, he may cancel the test. However, he was strongly encouraged to contact us and speak with financial counselors if the cost of the test is above what he is able to spend.  He will be started on a baby aspirin daily and offered sublingual nitroglycerin. He is reluctant to take this because he has not had chest pain. Additionally, his resting blood  pressure is 98 systolic, so nitroglycerin he not be well tolerated.  Current medicines are reviewed at length with the patient today.  The patient does not have concerns regarding medicines.  The following changes have been made:  Add aspirin  Labs/ tests ordered today include:   Orders Placed This Encounter  Procedures  . Lipid Profile  . Hepatic function panel  . Myocardial Perfusion Imaging     Disposition:   FU with me after the stress test.  Signed, Lenoard Aden  04/07/2015 2:27 PM    [facility] [address], [place], [place]  [address] Phone: [phone]; Fax: [phone]

## 2015-04-15 ENCOUNTER — Telehealth (HOSPITAL_COMMUNITY): Payer: Self-pay

## 2015-04-15 NOTE — Telephone Encounter (Signed)
Patient given detailed instructions per Myocardial Perfusion Study Information Sheet for the test on 04-20-2015 at 0945. Patient notified to arrive 15 minutes early and that it is imperative to arrive on time for appointment to keep from having the test rescheduled.  If you need to cancel or reschedule your appointment, please call the office within 24 hours of your appointment. Failure to do so may result in a cancellation of your appointment, and a $50 no show fee. Patient verbalized understanding.Oletta Lamas, Oluwatobi Visser A

## 2015-04-20 ENCOUNTER — Ambulatory Visit (HOSPITAL_COMMUNITY): Payer: Commercial Managed Care - PPO | Attending: Cardiovascular Disease

## 2015-04-20 ENCOUNTER — Other Ambulatory Visit (INDEPENDENT_AMBULATORY_CARE_PROVIDER_SITE_OTHER): Payer: Commercial Managed Care - PPO | Admitting: *Deleted

## 2015-04-20 DIAGNOSIS — R0609 Other forms of dyspnea: Secondary | ICD-10-CM

## 2015-04-20 LAB — MYOCARDIAL PERFUSION IMAGING
CHL CUP MPHR: 158 {beats}/min
CHL CUP NUCLEAR SDS: 0
CHL CUP NUCLEAR SRS: 0
CHL CUP RESTING HR STRESS: 50 {beats}/min
CSEPED: 9 min
CSEPEDS: 0 s
CSEPHR: 99 %
Estimated workload: 10.1 METS
LV sys vol: 45 mL
LVDIAVOL: 110 mL
Peak HR: 157 {beats}/min
RATE: 0.2
SSS: 0
TID: 0.9

## 2015-04-20 MED ORDER — TECHNETIUM TC 99M SESTAMIBI GENERIC - CARDIOLITE
10.7000 | Freq: Once | INTRAVENOUS | Status: AC | PRN
  Administered 2015-04-20: 11 via INTRAVENOUS

## 2015-04-20 MED ORDER — TECHNETIUM TC 99M SESTAMIBI GENERIC - CARDIOLITE
32.3000 | Freq: Once | INTRAVENOUS | Status: AC | PRN
  Administered 2015-04-20: 32.3 via INTRAVENOUS

## 2015-04-21 LAB — HEPATIC FUNCTION PANEL
ALBUMIN: 4.2 g/dL (ref 3.6–5.1)
ALT: 15 U/L (ref 9–46)
AST: 27 U/L (ref 10–35)
Alkaline Phosphatase: 59 U/L (ref 40–115)
BILIRUBIN TOTAL: 0.8 mg/dL (ref 0.2–1.2)
Bilirubin, Direct: 0.2 mg/dL (ref <=0.2)
Indirect Bilirubin: 0.6 mg/dL (ref 0.2–1.2)
TOTAL PROTEIN: 7 g/dL (ref 6.1–8.1)

## 2015-04-21 LAB — LIPID PANEL
CHOL/HDL RATIO: 2.6 ratio (ref <=5.0)
Cholesterol: 166 mg/dL (ref 125–200)
HDL: 63 mg/dL (ref >=40)
LDL CALC: 91 mg/dL (ref <130)
TRIGLYCERIDES: 59 mg/dL (ref <150)
VLDL: 12 mg/dL (ref <30)

## 2015-04-26 ENCOUNTER — Telehealth: Payer: Self-pay | Admitting: Physician Assistant

## 2015-04-26 NOTE — Telephone Encounter (Signed)
Called pt to discuss his recent lab, MV results.   Reviewed all testing with patient.   Advised him that with him going 9" on Bruce protocol and no scar or ischemia on MV, EF 59%, I did not feel further cardiac workup was indicated. I feel his is at low risk for PE since he is working every night.  Advised him that his lipid profile and LFTs were fine.   Advised him that since testing was OK, perhaps his exercise capacity is still sub-optimal after his prolonged illness in June/July.  Requested he do an exercise program where he goes out at whatever pace he wishes, stops when he needs to, and walks back. Increase the amount he goes out every 3 days and see how he does.   Advised him he needs to eat regularly, use meal bars and water if he does not feel like eating. I feel a good weight for him is about 150 lbs, try to put on some muscle and see if he feels better.  Pt appreciative and will try.  Rosaria Ferries, Hershal Coria 04/26/2015 4:17 PM Beeper [phone]

## 2015-04-29 ENCOUNTER — Encounter: Payer: Self-pay | Admitting: Internal Medicine

## 2015-04-30 ENCOUNTER — Telehealth: Payer: Self-pay | Admitting: Physician Assistant

## 2015-04-30 NOTE — Telephone Encounter (Signed)
Spoke with pt. He is calling to see if he needs scheduled follow up appt next week.  He has spoken with Rosaria Ferries, PA about stress test results. He reports he is feeling fine.  His preference would be to cancel the appt if OK to do so.  I told him I would cancel the appt for 05/04/15 and would forward to Rosaria Ferries, PA for recommendations regarding further follow up.  Pt aware we will call him next week with recommendations.

## 2015-04-30 NOTE — Telephone Encounter (Signed)
New message      Called pt to confirm Tuesday appt. He said he already received his stress test results so will he still need to keep appt on tues?  Please call

## 2015-05-04 ENCOUNTER — Ambulatory Visit: Payer: Commercial Managed Care - PPO | Admitting: Physician Assistant

## 2015-05-14 NOTE — Telephone Encounter (Signed)
Received message from Rosaria Ferries, PA that pt should follow up in one year.  I spoke with pt and he has no preference as to which cardiologist he sees.  Will put in recalls for appt in November 2017.

## 2015-10-23 ENCOUNTER — Ambulatory Visit (INDEPENDENT_AMBULATORY_CARE_PROVIDER_SITE_OTHER): Payer: Commercial Managed Care - PPO | Admitting: Emergency Medicine

## 2015-10-23 ENCOUNTER — Ambulatory Visit (INDEPENDENT_AMBULATORY_CARE_PROVIDER_SITE_OTHER): Payer: Commercial Managed Care - PPO

## 2015-10-23 VITALS — BP 122/72 | HR 69 | Temp 97.7°F | Resp 17 | Ht 67.5 in | Wt 145.0 lb

## 2015-10-23 DIAGNOSIS — M7502 Adhesive capsulitis of left shoulder: Secondary | ICD-10-CM

## 2015-10-23 DIAGNOSIS — M25512 Pain in left shoulder: Secondary | ICD-10-CM

## 2015-10-23 NOTE — Patient Instructions (Addendum)
IF you received an x-ray today, you will receive an invoice from [facility]. Please contact [facility] at [phone] with questions or concerns regarding your invoice.   IF you received labwork today, you will receive an invoice from [employer]. Please contact [facility] at [phone] with questions or concerns regarding your invoice.   Our billing staff will not be able to assist you with questions regarding bills from these companies.  You will be contacted with the lab results as soon as they are available. The fastest way to get your results is to activate your My Chart account. Instructions are located on the last page of this paperwork. If you have not heard from [place] regarding the results in 2 weeks, please contact this office.     Adhesive Capsulitis Adhesive capsulitis is inflammation of the tendons and ligaments that surround the shoulder joint (shoulder capsule). This condition causes the shoulder to become stiff and painful to move. Adhesive capsulitis is also called frozen shoulder. CAUSES This condition may be caused by:  An injury to the shoulder joint.  Straining the shoulder.  Not moving the shoulder for a period of time. This can happen if your arm was injured or in a sling.  Long-standing health problems, such as:  Diabetes.  Thyroid problems.  Heart disease.  Stroke.  Rheumatoid arthritis.  Lung disease. In some cases, the cause may not be known. RISK FACTORS This condition is more likely to develop in:  Women.  People who are older than 64 years of age. SYMPTOMS Symptoms of this condition include:  Pain in the shoulder when moving the arm. There may also be pain when parts of the shoulder are touched. The pain is worse at night or when at rest.  Soreness or aching in the shoulder.  Inability to move the shoulder normally.  Muscle spasms. DIAGNOSIS This condition is diagnosed with a physical  exam and imaging tests, such as an X-ray or MRI. TREATMENT This condition may be treated with:  Treatment of the underlying cause or condition.  Physical therapy. This involves performing exercises to get the shoulder moving again.  Medicine. Medicine may be given to relieve pain, inflammation, or muscle spasms.  Steroid injections into the shoulder joint.  Shoulder manipulation. This is a procedure to move the shoulder into another position. It is done after you are given a medicine to make you fall asleep (general anesthetic). The joint may also be injected with salt water at high pressure to break down scarring.  Surgery. This may be done in severe cases when other treatments have failed. Although most people recover completely from adhesive capsulitis, some may not regain the full movement of the shoulder. HOME CARE INSTRUCTIONS  Take over-the-counter and prescription medicines only as told by your health care provider.  If you are being treated with physical therapy, follow instructions from your physical therapist.  Avoid exercises that put a lot of demand on your shoulder, such as throwing. These exercises can make pain worse.  If directed, apply ice to the injured area:  Put ice in a plastic bag.  Place a towel between your skin and the bag.  Leave the ice on for 20 minutes, 2-3 times per day. SEEK MEDICAL CARE IF:  You develop new symptoms.  Your symptoms get worse.   This information is not intended to replace advice given to you by your health care provider. Make sure you discuss any questions you have with your  health care provider.   Document Released: 03/26/2009 Document Revised: 02/17/2015 Document Reviewed: 09/21/2014 Elsevier Interactive Patient Education [employer].

## 2015-10-23 NOTE — Progress Notes (Signed)
Patient ID: Othie Pickar, male   DOB: 11-06-1950, 65 y.o.   MRN: [id number]    By signing my name below, I, Essence Howell, attest that this documentation has been prepared under the direction and in the presence of Darlyne Russian, MD Electronically Signed: Ladene Artist, ED Scribe 10/23/2015 at 11:47 AM.  Chief Complaint:  Chief Complaint  Patient presents with  . Arm Pain    left side    HPI: Germain Vankooten is a 65 y.o. male who reports to [facility] today complaining of sudden onset of constant left shoulder pain that radiates into left arm for the past 6 weeks. Pt initially noticed the pain 6 weeks ago while reaching into the back of the refrigerator. Pain is exacerbated with movement, specifically lifting his shoulder. He denies fall or injury. No treatments tried PTA. Pt denies neck pain or right shoulder pain. 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   Past Medical History  Diagnosis Date  . Kidney stone   . Anemia   . 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 Malnourished [facility])    Past Surgical History  Procedure Laterality Date  . Tonsillectomy    . Kidney stone surgery    . Tonsillectomy     Social History   Social History  . Marital Status: Single    Spouse Name: N/A  . Number of Children: N/A  . Years of Education: N/A   Social History Main Topics  . Smoking status: Never Smoker   . Smokeless tobacco: Never Used  . Alcohol Use: 0.5 oz/week    1 Standard drinks or equivalent per week  . Drug Use: No  . Sexual Activity: No   Other Topics Concern  . None   Social History Narrative   Family History  Problem Relation Age of Onset  . Hypertension Mother   . Uterine cancer Mother   . Obesity Mother   . Hyperlipidemia Mother   . Heart disease Mother   . Diabetes Mother   . Cancer Mother   . Obesity Father   . Hypertension Father   . Diabetes Father     . Heart disease Father   . Hyperlipidemia Father   . Stroke Brother   . Obesity Brother   . Alzheimer's disease Brother   . Diabetes Brother   . Heart disease Brother   . Hyperlipidemia Brother   . Hypertension Brother   . Cataracts Maternal Grandmother   . Heart attack Father     First one in his 2s   No Known Allergies Prior to Admission medications   Medication Sig Start Date End Date Taking? Authorizing Provider  aspirin EC 81 MG tablet Take 1 tablet (81 mg total) by mouth daily. 04/07/15  Yes Rhonda G Barrett, PA-C  IRON PO Take 1 tablet by mouth daily.   Yes Historical Provider, MD  nitroGLYCERIN (NITROSTAT) 0.4 MG SL tablet Place 1 tablet (0.4 mg total) under the tongue every 5 (five) minutes as needed for chest pain. Patient not taking: Reported on 10/23/2015 04/07/15   Evelene Croon Barrett, PA-C   ROS: The patient denies fevers, chills, night sweats, unintentional weight loss, chest pain, palpitations, wheezing, dyspnea on exertion, nausea, vomiting, abdominal pain, dysuria, hematuria, melena, numbness, weakness, or tingling.   All other systems have been reviewed and were otherwise negative with the exception of those mentioned in the HPI and as  above.    PHYSICAL EXAM: Filed Vitals:   10/23/15 0938  BP: 122/72  Pulse: 69  Temp: 97.7 F (36.5 C)  Resp: 17   Body mass index is 22.36 kg/(m^2).  General: Alert, no acute distress HEENT:  Normocephalic, atraumatic, oropharynx patent. Eye: [facility] Cardiovascular: Regular rate and rhythm, no rubs murmurs or gallops. No Carotid bruits, radial pulse intact. No pedal edema.  Respiratory: Clear to auscultation bilaterally. No wheezes, rales, or rhonchi. No cyanosis, no use of accessory musculature Abdominal: No organomegaly, abdomen is soft and non-tender, positive bowel sounds. No masses. Musculoskeletal: Gait intact. No edema. Unable to abduct L shoulder past 45 degrees. Severely limited internal and external rotation.  Does not appear to be any rotator cuff weakness.  Skin: No rashes. Neurologic: Facial musculature symmetric. Psychiatric: Patient acts appropriately throughout our interaction. Lymphatic: No cervical or submandibular lymphadenopathy  LABS:  EKG/XRAY:   Primary read interpreted by Dr. Everlene Farrier at [facility]  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 Was instructed on exercises. He will take one Aleve twice a day.I personally performed the services described in this documentation, which was scribed in my presence. The recorded information has been reviewed and is accurate.    Gross sideeffects, risk and benefits, and alternatives of medications d/w patient. Patient is aware that all medications have potential sideeffects and we are unable to predict every sideeffect or drug-drug interaction that may occur.  Arlyss Queen MD 10/23/2015 11:14 AM

## 2016-01-15 IMAGING — CR DG CHEST 2V
2 series · 2 of 2 positions shown · non-contrast
Comparison: 05/18/2013

CLINICAL DATA: Cough, shortness of breath.

EXAM:
CHEST  2 VIEW

[PA]
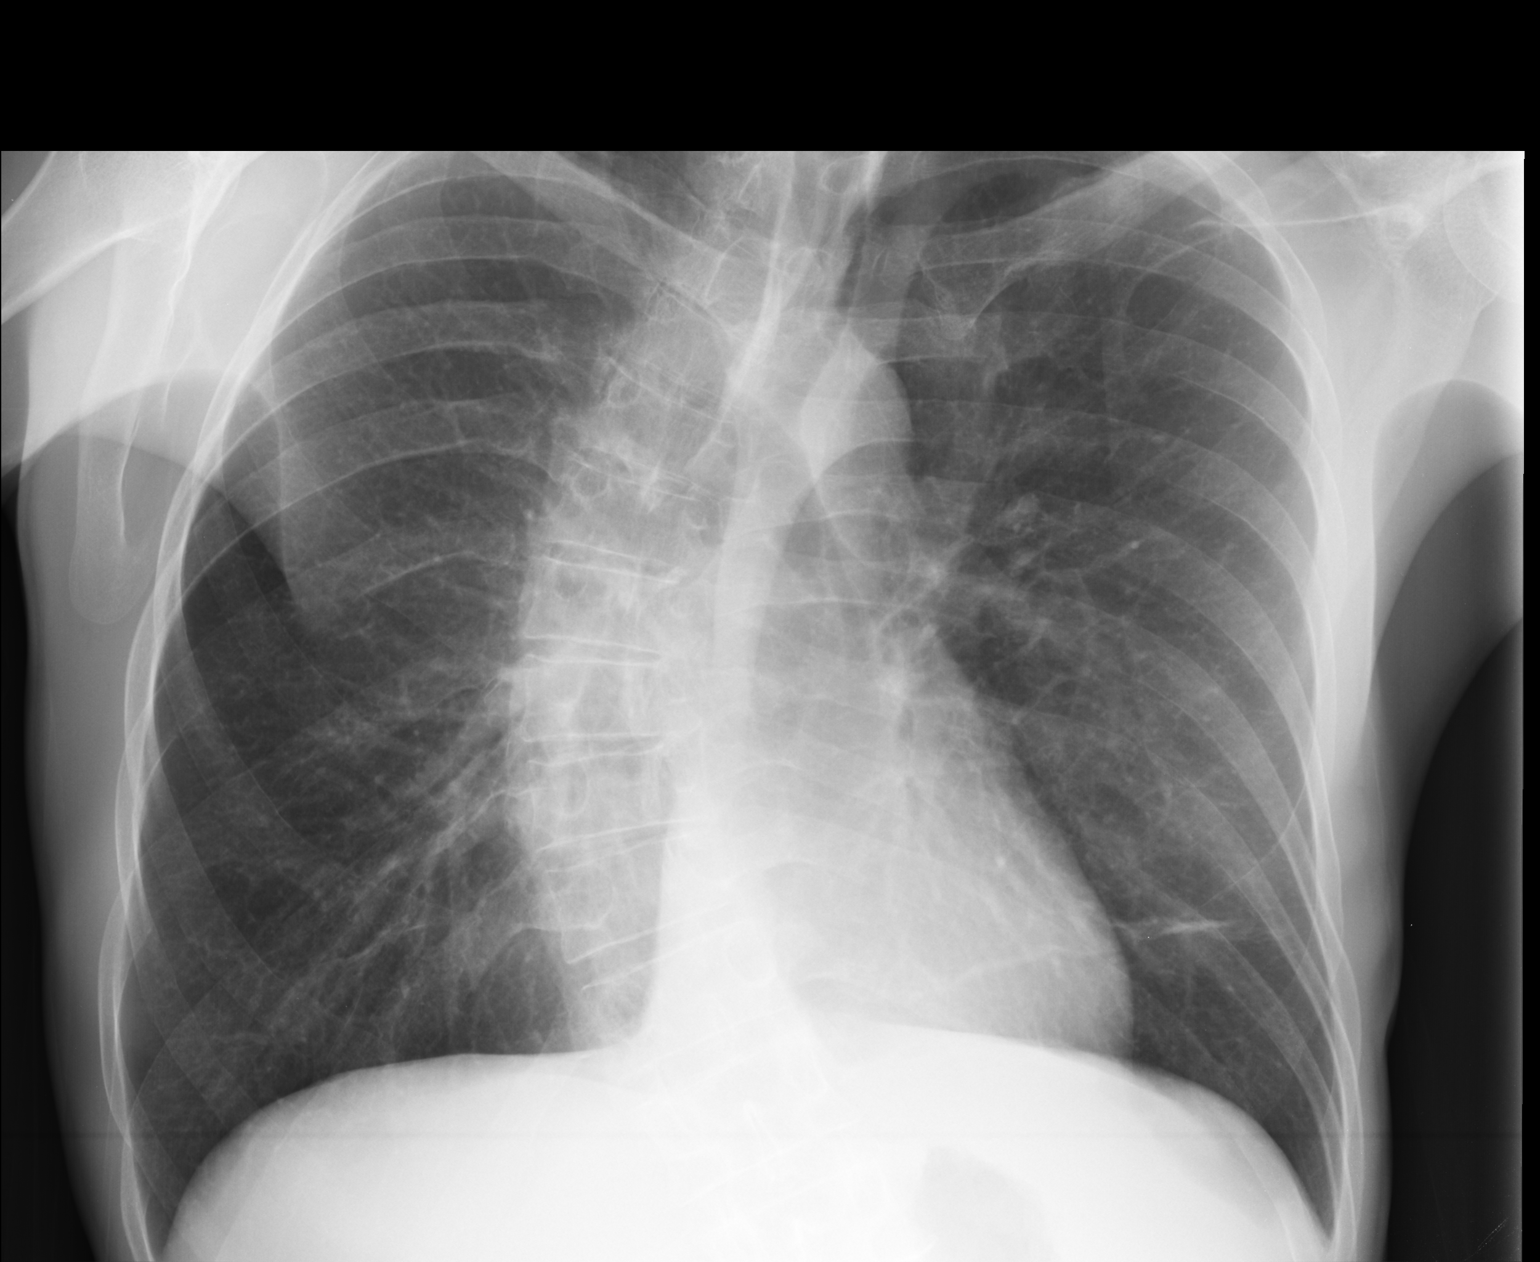

[lateral]
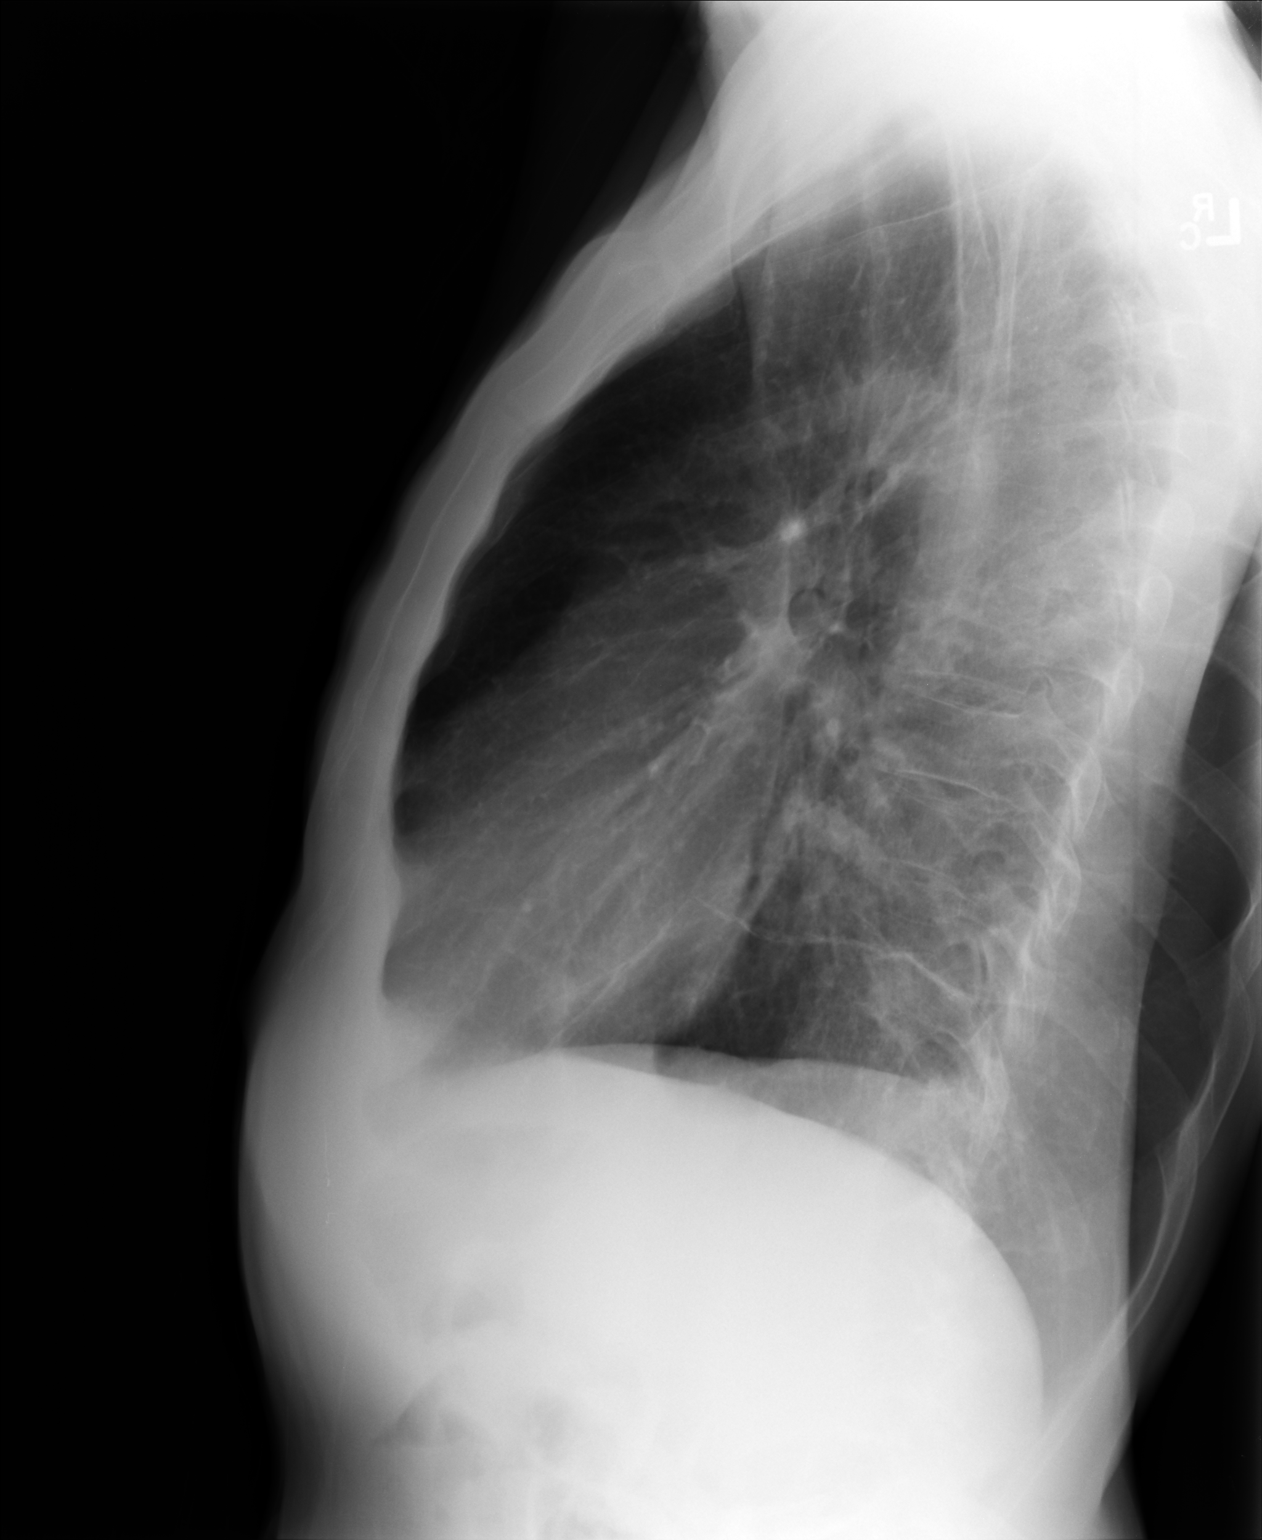

[2 of 2 positions shown; findings below may reference images not displayed]

FINDINGS: Rightward scoliosis in the thoracic spine. Linear subsegmental
atelectasis at the left base. Right lung is clear. Heart is normal
size. No effusions. No acute bony abnormality.
IMPRESSION: Minimal lingular subsegmental atelectasis.  Scoliosis.

## 2016-05-26 ENCOUNTER — Ambulatory Visit (INDEPENDENT_AMBULATORY_CARE_PROVIDER_SITE_OTHER): Payer: Commercial Managed Care - PPO | Admitting: Physician Assistant

## 2016-05-26 VITALS — BP 122/72 | HR 59 | Temp 97.8°F | Resp 17 | Ht 68.5 in | Wt 138.0 lb

## 2016-05-26 DIAGNOSIS — Z7189 Other specified counseling: Secondary | ICD-10-CM

## 2016-05-26 DIAGNOSIS — N529 Male erectile dysfunction, unspecified: Secondary | ICD-10-CM | POA: Diagnosis not present

## 2016-05-26 MED ORDER — SILDENAFIL CITRATE 100 MG PO TABS: 50.0000 mg | ORAL_TABLET | Freq: Every day | ORAL | 2 refills | Status: DC | PRN

## 2016-05-26 NOTE — Progress Notes (Signed)
Jacob Logan  MRN: [id number] DOB: June 30, 1950  PCP: [name], MD  Subjective:  Pt is a pleasant 65 year old male who presents to clinic for erectile dysfunction. His last coitus was 12 years ago. At that time he was unable to maintain an erection. His partner "made fun" of him and teased him about not being able to maintain the erection and asked him "are you gay".  Recently, he has been able to get an erection, "but it goes away if I change position". He is able to ejaculate. He does not believe he will be able to "penetrate a woman". He is currently dating a woman. He has discussed his erectile dysfunction history with her. They have not had a sexual encounter. He has never tried ED medication in the past.  Denies chest pain, palpitations, history of diabetes, HTN, low testosterone.   Dyspnea on exertion - He is a runner and for the last few years notes increased shortness of breath after running. He has been worked up with cardiology, normal stress test. Normal lipids and LFTs   Review of Systems  Constitutional: Negative for chills and diaphoresis.  Respiratory: Negative for cough, chest tightness, shortness of breath and wheezing.   Cardiovascular: Negative for chest pain, palpitations and leg swelling.  Gastrointestinal: Negative for diarrhea, nausea and vomiting.  Genitourinary: Negative for discharge and penile pain.  Neurological: Negative for dizziness, syncope, light-headedness and headaches.  Psychiatric/Behavioral: Negative for sleep disturbance. The patient is not nervous/anxious.     Patient Active Problem List   Diagnosis Date Noted  . Dyspnea on exertion, possible anginal equivalent 04/07/2015  . CKD (chronic kidney disease), stage III 11/22/2014  . Renal stone 11/19/2014  . Malnutrition of moderate degree ([place]) 11/19/2014  . Discitis of cervical region 11/18/2014  . Osteomyelitis of cervical spine ([place]) 11/18/2014  . Ureterolithiasis 10/19/2014  . Scoliosis  (and kyphoscoliosis), idiopathic 10/18/2014    Current Outpatient Prescriptions on File Prior to Visit  Medication Sig Dispense Refill  . aspirin EC 81 MG tablet Take 1 tablet (81 mg total) by mouth daily. (Patient not taking: Reported on 05/26/2016) 90 tablet 3  . IRON PO Take 1 tablet by mouth daily.    . nitroGLYCERIN (NITROSTAT) 0.4 MG SL tablet Place 1 tablet (0.4 mg total) under the tongue every 5 (five) minutes as needed for chest pain. (Patient not taking: Reported on 05/26/2016) 25 tablet 3   No current facility-administered medications on file prior to visit.     No Known Allergies   Objective:  BP 122/72 (BP Location: Right Arm, Patient Position: Sitting, Cuff Size: Normal)   Pulse (!) 59   Temp 97.8 F (36.6 C) (Oral)   Resp 17   Ht 5' 8.5" (1.74 m)   Wt 138 lb (62.6 kg)   SpO2 98%   BMI 20.68 kg/m   Physical Exam  Constitutional: He is oriented to person, place, and time and well-developed, well-nourished, and in no distress. No distress.  Cardiovascular: Normal rate, regular rhythm and normal heart sounds.   Pulmonary/Chest: Effort normal. No respiratory distress.  Neurological: He is alert and oriented to person, place, and time. GCS score is 15.  Skin: Skin is warm and dry.  Psychiatric: Mood, memory, affect and judgment normal.  Vitals reviewed.   Assessment and Plan :  1. Erectile dysfunction, unspecified erectile dysfunction type 2. Encounter for medication counseling - sildenafil (VIAGRA) 100 MG tablet; Take 0.5-1 tablets (50-100 mg total) by mouth daily as needed  for erectile dysfunction.  Dispense: 5 tablet; Refill: 2 - Testosterone - Labs pending.  - Discussed psychological aspect of getting and maintaining an erection, adding his previous experience with prior partner may have an effect on his emotional/mental sexual health.  - Discussed at length with patient side effects of this medication and red flags that warrant visit to emergency department.     Mercer Pod, PA-C  [facility] 05/26/2016 9:57 AM

## 2016-05-26 NOTE — Patient Instructions (Addendum)
  Thank you for coming in today. I hope you feel we met your needs.  Feel free to call [facility] if you have any questions or further requests.  Please consider signing up for MyChart if you do not already have it, as this is a great way to communicate with me.  Best,  Whitney McVey, PA-C   IF you received an x-ray today, you will receive an invoice from [facility]. Please contact [facility] at [phone] with questions or concerns regarding your invoice.   IF you received labwork today, you will receive an invoice from [facility]. Please contact [facility] at [phone] with questions or concerns regarding your invoice.   Our billing staff will not be able to assist you with questions regarding bills from these companies.  You will be contacted with the lab results as soon as they are available. The fastest way to get your results is to activate your My Chart account. Instructions are located on the last page of this paperwork. If you have not heard from us regarding the results in 2 weeks, please contact this office.      

## 2016-05-27 LAB — TESTOSTERONE: Testosterone: 904 ng/dL (ref 264–916)

## 2016-05-29 ENCOUNTER — Encounter: Payer: Self-pay | Admitting: Physician Assistant

## 2016-05-29 ENCOUNTER — Telehealth: Payer: Self-pay

## 2016-05-29 NOTE — Telephone Encounter (Signed)
Pt is needing to check on status of his viagra RX and if it has been authorized   [employer] number [phone]

## 2016-05-29 NOTE — Telephone Encounter (Signed)
Patient was called and he was advised that the medication was sent over on 05/26/2016.  The patient indicated that the pharmacy called and stated there was a problem with that order regarding authorization.    Please look into this.

## 2016-05-29 NOTE — Progress Notes (Signed)
Testosterone level is normal. Will treat for erectile dysfunction. advised patient.

## 2016-05-31 NOTE — Telephone Encounter (Signed)
Completed PA and approved through 05/31/17. Notified pharm. Could not reach pt so asked pharm to alert pt when Rx is ready.

## 2016-05-31 NOTE — Telephone Encounter (Signed)
Started PA on covermymeds. Waiting for clin ?s to load.

## 2016-07-26 ENCOUNTER — Ambulatory Visit (INDEPENDENT_AMBULATORY_CARE_PROVIDER_SITE_OTHER): Payer: Commercial Managed Care - PPO | Admitting: Emergency Medicine

## 2016-07-26 ENCOUNTER — Ambulatory Visit (INDEPENDENT_AMBULATORY_CARE_PROVIDER_SITE_OTHER): Payer: Commercial Managed Care - PPO

## 2016-07-26 VITALS — BP 110/76 | HR 58 | Temp 98.3°F | Resp 16 | Ht 68.0 in | Wt 143.0 lb

## 2016-07-26 DIAGNOSIS — M25571 Pain in right ankle and joints of right foot: Secondary | ICD-10-CM | POA: Diagnosis not present

## 2016-07-26 NOTE — Progress Notes (Signed)
Jacob Logan 66 y.o.   Chief Complaint  Patient presents with  . FOOT PAIN    right, X 5-6 weeks, comes and goes     HISTORY OF PRESENT ILLNESS: This is a 66 y.o. male complaining of atraumatic right foot pain x several weeks.  Foot Injury   The incident occurred more than 1 week ago. There was no injury mechanism. The pain is present in the right foot. The quality of the pain is described as aching. The pain is at a severity of 3/10. The pain is mild. The pain has been intermittent since onset. Pertinent negatives include no loss of motion, loss of sensation, muscle weakness, numbness or tingling.     Prior to Admission medications   Medication Sig Start Date End Date Taking? Authorizing Provider  IRON PO Take 1 tablet by mouth daily.    Historical Provider, MD  sildenafil (VIAGRA) 100 MG tablet Take 0.5-1 tablets (50-100 mg total) by mouth daily as needed for erectile dysfunction. Patient not taking: Reported on 07/26/2016 05/26/16   Gelene Mink McVey, PA-C    No Known Allergies  Patient Active Problem List   Diagnosis Date Noted  . Dyspnea on exertion, possible anginal equivalent 04/07/2015  . CKD (chronic kidney disease), stage III 11/22/2014  . Renal stone 11/19/2014  . Malnutrition of moderate degree ([place]) 11/19/2014  . Scoliosis (and kyphoscoliosis), idiopathic 10/18/2014    Past Medical History:  Diagnosis Date  . Anemia   . Kidney stone   . Malnourished ([place])   . 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 KIDNEY STONE SURGERY    . TONSILLECTOMY    . TONSILLECTOMY      Social History   Social History  . Marital status: Single    Spouse name: N/A  . Number of children: N/A  . Years of education: N/A   Occupational History  . Not on file.   Social History Main Topics  . Smoking status: Never Smoker  . Smokeless tobacco: Never Used  . Alcohol use 0.5 oz/week    1 Standard drinks or  equivalent per week  . Drug use: No  . Sexual activity: No   Other Topics Concern  . Not on file   Social History Narrative  . No narrative on file    Family History  Problem Relation Age of Onset  . Hypertension Mother   . Uterine cancer Mother   . Obesity Mother   . Hyperlipidemia Mother   . Heart disease Mother   . Diabetes Mother   . Cancer Mother   . Obesity Father   . Hypertension Father   . Diabetes Father   . Heart disease Father   . Hyperlipidemia Father   . Heart attack Father     First one in his 18s  . Stroke Brother   . Obesity Brother   . Alzheimer's disease Brother   . Diabetes Brother   . Heart disease Brother   . Hyperlipidemia Brother   . Hypertension Brother   . Cataracts Maternal Grandmother      Review of Systems  Constitutional: Negative for chills and fever.  HENT: Negative.   Eyes: Negative.   Respiratory: Negative for shortness of breath.   Gastrointestinal: Negative for nausea and vomiting.  Musculoskeletal:       Right foot pain  Skin: Negative for rash.  Neurological: Negative for tingling and numbness.  All other systems reviewed  and are negative.  Vitals:   07/26/16 1534  BP: 110/76  Pulse: (!) 58  Resp: 16  Temp: 98.3 F (36.8 C)     Physical Exam  Constitutional: He is oriented to person, place, and time. He appears well-developed and well-nourished.  HENT:  Head: Normocephalic and atraumatic.  Eyes: EOM are normal. Pupils are equal, round, and reactive to light.  Neck: Normal range of motion. Neck supple.  Cardiovascular: Normal rate.   Pulmonary/Chest: Effort normal.  Musculoskeletal: Normal range of motion.  Right foot: NVI with FROM; no erythema or bruising; no different than left foot. No significant tenderness.  Neurological: He is alert and oriented to person, place, and time.  Skin: Skin is warm and dry. Capillary refill takes less than 2 seconds.  Psychiatric: He has a normal mood and affect. His behavior  is normal.  Vitals reviewed.  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  ASSESSMENT & PLAN: Jacob Logan was seen today for foot pain.  Diagnoses and all orders for this visit:  Pain in joint involving right ankle and foot -     DG Foot Complete Right; Future -     Ambulatory referral to Podiatry    Patient Instructions       IF you received an x-ray today, you will receive an invoice from [facility]. Please contact [facility] at [phone] with questions or concerns regarding your invoice.   IF you received labwork today, you will receive an invoice from [facility]. Please contact [facility] at [phone] with questions or concerns regarding your invoice.   Our billing staff will not be able to assist you with questions regarding bills from these companies.  You will be contacted with the lab results as soon as they are available. The fastest way to get your results is to activate your My Chart account. Instructions are located on the last page of this paperwork. If you have not heard from [place] regarding the results in 2 weeks, please contact this office.      Foot Pain Introduction Many things can cause foot pain. Some common causes are:  An injury.  A sprain.  Arthritis.  Blisters.  Bunions. Follow these instructions at home: Pay attention to any changes in your symptoms. Take these actions to help with your discomfort:  If directed, put ice on the affected area:  Put ice in a plastic bag.  Place a towel between your skin and the bag.  Leave the ice on for 15-20 minutes, 3?4 times a day for 2 days.  Take over-the-counter and prescription medicines only as told by your health care provider.  Wear comfortable, supportive shoes that fit you well. Do not wear high heels.  Do not stand or walk for long periods of time.  Do not lift a lot of weight. This can put added pressure on your  feet.  Do stretches to relieve foot pain and stiffness as told by your health care provider.  Rub your foot gently.  Keep your feet clean and dry. Contact a health care provider if:  Your pain does not get better after a few days of self-care.  Your pain gets worse.  You cannot stand on your foot. Get help right away if:  Your foot is numb or tingling.  Your foot or toes are swollen.  Your foot or toes turn white or blue.  You have warmth and redness along your foot. This  information is not intended to replace advice given to you by your health care provider. Make sure you discuss any questions you have with your health care provider. Document Released: 06/25/2015 Document Revised: 11/04/2015 Document Reviewed: 06/24/2014  2017 Elsevier      Agustina Caroli, MD [facility]

## 2016-07-26 NOTE — Patient Instructions (Addendum)
     IF you received an x-ray today, you will receive an invoice from [facility]. Please contact [facility] at [phone] with questions or concerns regarding your invoice.   IF you received labwork today, you will receive an invoice from [facility]. Please contact [facility] at [phone] with questions or concerns regarding your invoice.   Our billing staff will not be able to assist you with questions regarding bills from these companies.  You will be contacted with the lab results as soon as they are available. The fastest way to get your results is to activate your My Chart account. Instructions are located on the last page of this paperwork. If you have not heard from [place] regarding the results in 2 weeks, please contact this office.      Foot Pain Introduction Many things can cause foot pain. Some common causes are:  An injury.  A sprain.  Arthritis.  Blisters.  Bunions. Follow these instructions at home: Pay attention to any changes in your symptoms. Take these actions to help with your discomfort:  If directed, put ice on the affected area:  Put ice in a plastic bag.  Place a towel between your skin and the bag.  Leave the ice on for 15-20 minutes, 3?4 times a day for 2 days.  Take over-the-counter and prescription medicines only as told by your health care provider.  Wear comfortable, supportive shoes that fit you well. Do not wear high heels.  Do not stand or walk for long periods of time.  Do not lift a lot of weight. This can put added pressure on your feet.  Do stretches to relieve foot pain and stiffness as told by your health care provider.  Rub your foot gently.  Keep your feet clean and dry. Contact a health care provider if:  Your pain does not get better after a few days of self-care.  Your pain gets worse.  You cannot stand on your foot. Get help right away if:  Your foot is numb or tingling.  Your foot or toes are  swollen.  Your foot or toes turn white or blue.  You have warmth and redness along your foot. This information is not intended to replace advice given to you by your health care provider. Make sure you discuss any questions you have with your health care provider. Document Released: 06/25/2015 Document Revised: 11/04/2015 Document Reviewed: 06/24/2014  2017 Elsevier

## 2016-08-09 ENCOUNTER — Ambulatory Visit (INDEPENDENT_AMBULATORY_CARE_PROVIDER_SITE_OTHER): Payer: Commercial Managed Care - PPO | Admitting: Podiatry

## 2016-08-09 VITALS — BP 116/74 | HR 54

## 2016-08-09 DIAGNOSIS — M7751 Other enthesopathy of right foot: Secondary | ICD-10-CM

## 2016-08-09 DIAGNOSIS — G5761 Lesion of plantar nerve, right lower limb: Secondary | ICD-10-CM

## 2016-08-09 MED ORDER — MELOXICAM 15 MG PO TABS: 15.0000 mg | ORAL_TABLET | Freq: Every day | ORAL | 1 refills | Status: AC

## 2016-08-12 MED ORDER — BETAMETHASONE SOD PHOS & ACET 6 (3-3) MG/ML IJ SUSP: 3.0000 mg | Freq: Once | INTRAMUSCULAR | Status: DC

## 2016-08-12 NOTE — Progress Notes (Signed)
   Subjective:  Patient presents today as a new patient for evaluation of right foot pain is been going on since January 2018. Patient states that her shooting pain in his right foot reveals a broken glass. No alleviating factors.    Objective/Physical Exam General: The patient is alert and oriented x3 in no acute distress.  Dermatology: Skin is warm, dry and supple bilateral lower extremities. Negative for open lesions or macerations.  Vascular: Palpable pedal pulses bilaterally. No edema or erythema noted. Capillary refill within normal limits.  Neurological: Epicritic and protective threshold grossly intact bilaterally.   Musculoskeletal Exam: Pain on palpation to the second interspace between the metatarsal heads 2 and 3 right foot with lateral compression of the metatarsal heads indicated of positive Mulder sign. Pain on palpation range of motion also noted the second MPJ right foot.  Assessment: #1 Morton's neuroma second interspace right foot #2 capsulitis second MPJ right foot   Plan of Care:  #1 Patient was evaluated. #2 injection of 0.5 mL Celestone Soluspan injected in the second MPJ right foot as well as the second interspace. #3 prescription for Mobic #4 discussed the importance of wearing good supportive shoe gear with a wide toebox #5 return to clinic in 4 weeks   Edrick Kins, DPM [facility]  Dr. Edrick Kins, [address]                                        [place], [place] [address]                Office [phone]  Fax [phone]

## 2016-09-06 ENCOUNTER — Ambulatory Visit: Payer: Commercial Managed Care - PPO | Admitting: Podiatry

## 2017-05-28 ENCOUNTER — Encounter: Payer: Self-pay | Admitting: Physician Assistant

## 2017-05-28 ENCOUNTER — Other Ambulatory Visit: Payer: Self-pay

## 2017-05-28 ENCOUNTER — Ambulatory Visit (INDEPENDENT_AMBULATORY_CARE_PROVIDER_SITE_OTHER): Payer: Commercial Managed Care - PPO | Admitting: Physician Assistant

## 2017-05-28 VITALS — BP 105/67 | HR 58 | Temp 98.5°F | Resp 16 | Ht 68.0 in | Wt 138.0 lb

## 2017-05-28 DIAGNOSIS — R05 Cough: Secondary | ICD-10-CM | POA: Diagnosis not present

## 2017-05-28 DIAGNOSIS — R059 Cough, unspecified: Secondary | ICD-10-CM

## 2017-05-28 NOTE — Progress Notes (Signed)
05/28/2017 12:09 PM   DOB: 1950-06-14 / MRN: [id number]  SUBJECTIVE:  Jacob Logan is a 66 y.o. male presenting for lingering cough that started 9 days ago and is preceeded by sore throat. Denies a history of pneumonia.  Tells me that someone at his work was recently diagnosed with pneumonia.  No SOB, DOE, or chest pain.  THe cough is keeping him from sleeping. He has tried dayquil and Mucinex.   He has No Known Allergies.   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    He  reports that  has never smoked. he has never used smokeless tobacco. He reports that he drinks about 0.5 oz of alcohol per week. He reports that he does not use drugs. He  reports that he does not engage in sexual activity. The patient  has a past surgical history that includes Tonsillectomy; Kidney stone surgery; and Tonsillectomy.  His family history includes Alzheimer's disease in his brother; Cancer in his mother; Cataracts in his maternal grandmother; Diabetes in his brother, father, and mother; Heart attack in his father; Heart disease in his brother, father, and mother; Hyperlipidemia in his brother, father, and mother; Hypertension in his brother, father, and mother; Obesity in his brother, father, and mother; Stroke in his brother; Uterine cancer in his mother.  Review of Systems  Constitutional: Negative for chills, diaphoresis and fever.  Respiratory: Positive for cough. Negative for hemoptysis, sputum production, shortness of breath and wheezing.   Cardiovascular: Negative for chest pain, orthopnea and leg swelling.  Gastrointestinal: Negative for nausea.  Skin: Negative for rash.  Neurological: Negative for dizziness.    The problem list and medications were reviewed and updated by myself where necessary and exist elsewhere in the encounter.   OBJECTIVE:  BP 105/67 (BP Location: Right Arm, Patient Position: Sitting,  Cuff Size: Normal)   Pulse (!) 58   Temp 98.5 F (36.9 C) (Oral)   Resp 16   Ht 5\' 8"  (1.727 m)   Wt 138 lb (62.6 kg)   SpO2 98%   BMI 20.98 kg/m   Wt Readings from Last 3 Encounters:  05/28/17 138 lb (62.6 kg)  07/26/16 143 lb (64.9 kg)  05/26/16 138 lb (62.6 kg)   Temp Readings from Last 3 Encounters:  05/28/17 98.5 F (36.9 C) (Oral)  07/26/16 98.3 F (36.8 C) (Oral)  05/26/16 97.8 F (36.6 C) (Oral)   BP Readings from Last 3 Encounters:  05/28/17 105/67  08/09/16 116/74  07/26/16 110/76   Pulse Readings from Last 3 Encounters:  05/28/17 (!) 58  08/09/16 (!) 54  07/26/16 (!) 58     Physical Exam  Constitutional: He appears well-developed. He is active and cooperative.  Non-toxic appearance.  HENT:  Right Ear: Hearing, tympanic membrane, external ear and ear canal normal.  Left Ear: Hearing, tympanic membrane, external ear and ear canal normal.  Nose: Nose normal. Right sinus exhibits no maxillary sinus tenderness and no frontal sinus tenderness. Left sinus exhibits no maxillary sinus tenderness and no frontal sinus tenderness.  Mouth/Throat: Uvula is midline, oropharynx is clear and moist and mucous membranes are normal. No oropharyngeal exudate, posterior oropharyngeal edema or tonsillar abscesses.  Eyes: Conjunctivae are normal. Pupils are equal, round, and reactive to light.  Cardiovascular: Normal rate, regular rhythm, S1 normal, S2 normal, normal heart sounds, intact distal pulses and normal pulses. Exam reveals no gallop and no friction rub.  No murmur heard. Pulmonary/Chest: Effort normal. No stridor. No tachypnea. No respiratory distress. He has no wheezes. He has no rales.  Abdominal: He exhibits no distension.  Musculoskeletal: He exhibits no edema.  Lymphadenopathy:       Head (right side): No submandibular and no tonsillar adenopathy present.       Head (left side): No submandibular and no tonsillar adenopathy present.    He has no cervical  adenopathy.  Neurological: He is alert.  Skin: Skin is warm and dry. He is not diaphoretic. No pallor.  Vitals reviewed.   No results found for this or any previous visit (from the past 72 hour(s)).  No results found.  ASSESSMENT AND PLAN:  Jacob Logan was seen today for cough.  Diagnoses and all orders for this visit:  Cough: Preceded by sore throat.  ROS benign.  Exam benign.  Likely viral.  Advised he stay the course.     The patient is advised to call or return to clinic if he does not see an improvement in symptoms, or to seek the care of the closest emergency department if he worsens with the above plan.   Philis Fendt, MHS, PA-C [facility] 05/28/2017 12:09 PM

## 2017-09-27 ENCOUNTER — Encounter: Payer: Self-pay | Admitting: Family Medicine

## 2017-09-27 ENCOUNTER — Ambulatory Visit: Payer: Commercial Managed Care - PPO | Admitting: Family Medicine

## 2017-09-27 ENCOUNTER — Other Ambulatory Visit: Payer: Self-pay

## 2017-09-27 VITALS — BP 123/79 | HR 59 | Temp 97.6°F | Ht 69.0 in | Wt 137.6 lb

## 2017-09-27 DIAGNOSIS — L989 Disorder of the skin and subcutaneous tissue, unspecified: Secondary | ICD-10-CM | POA: Diagnosis not present

## 2017-09-27 DIAGNOSIS — N529 Male erectile dysfunction, unspecified: Secondary | ICD-10-CM

## 2017-09-27 DIAGNOSIS — Z23 Encounter for immunization: Secondary | ICD-10-CM

## 2017-09-27 DIAGNOSIS — Z1211 Encounter for screening for malignant neoplasm of colon: Secondary | ICD-10-CM

## 2017-09-27 MED ORDER — SILDENAFIL CITRATE 100 MG PO TABS: 50.0000 mg | ORAL_TABLET | Freq: Every day | ORAL | 2 refills | Status: DC | PRN

## 2017-09-27 NOTE — Patient Instructions (Addendum)
I refilled the sildenafil, but do not take that medicine if any lightheadedness, dizziness, shortness of breath or chest pains. Use lowest effective dose.   I referred you to dermatology for the scalp lesion.   Please return to discuss the shortness of breath further, but last stress test and heart evaluation was reassuring.Return to the clinic or go to the nearest emergency room if any of your symptoms worsen or new symptoms occur.  Additionally I would like you to schedule a physical,  but will refer you to gastroenterology to discuss a repeat screening colonoscopy and the cost involved. 1st dose of the pneumonia vaccine series was also given today.   Thanks for coming in today.    IF you received an x-ray today, you will receive an invoice from [facility]. Please contact [facility] at [phone] with questions or concerns regarding your invoice.   IF you received labwork today, you will receive an invoice from [facility]. Please contact [facility] at [phone] with questions or concerns regarding your invoice.   Our billing staff will not be able to assist you with questions regarding bills from these companies.  You will be contacted with the lab results as soon as they are available. The fastest way to get your results is to activate your My Chart account. Instructions are located on the last page of this paperwork. If you have not heard from [place] regarding the results in 2 weeks, please contact this office.

## 2017-09-27 NOTE — Progress Notes (Signed)
Subjective:  By signing my name below, I, Essence Howell, attest that this documentation has been prepared under the direction and in the presence of [name], MD Electronically Signed: Ladene Artist, ED Scribe 09/27/2017 at 10:50 AM.   Patient ID: Jacob Logan, male    DOB: 18-Dec-1950, 67 y.o.   MRN: [id number]  Chief Complaint  Patient presents with  . Spot on forehead    1 spot that is pink and scabby    HPI Jacob Logan is a 67 y.o. male, with a h/o CKD, who presents to Primary Care at [facility] complaining of a questionable area on his L scalp first noticed a few yrs ago. Pt states that the area is occasionally pruritic. He has removed the area several times but it returns as a pink, scaly area. Denies change in size or color. No personal or family h/o skin CA. Pt has not seen dermatology.  ED Pt also requests a med refill of sildenafil. He is taking 1/2 tab at a time with last use being 4 wks ago. Denies visual disturbances, change in hearing, cp, lightheadedness or dizziness with intercourse or on exertion. States he is still having sob on exertion that is unchanged, none with walking. Last filled in 2017 by [facility], PA-C. Last evaluated in Oct 2016 at [facility] for increasing dyspnea on exertion. Low risk stress test Nov 2016, EF 59%, normal myocardial profusion. Family h/o premature heart disease in father at age 78.  Health Maintenance Colonoscopy at age 59 in [place].  Immunizations Immunization History  Administered Date(s) Administered  . Influenza,inj,Quad PF,6+ Mos 03/29/2015, 02/24/2016  Pneumonia: today  Patient Active Problem List   Diagnosis Date Noted  . Dyspnea on exertion, possible anginal equivalent 04/07/2015  . CKD (chronic kidney disease), stage III ([place]) 11/22/2014  . Renal stone 11/19/2014  . Malnutrition of moderate degree ([place]) 11/19/2014  . Scoliosis (and kyphoscoliosis), idiopathic 10/18/2014   Past Medical History:  Diagnosis Date  .  Anemia   . Kidney stone   . Malnourished ([place])   . Osteomyelitis of cervical spine ([place]) 11/2014   Past Surgical History:  Procedure Laterality Date  . KIDNEY STONE SURGERY    . TONSILLECTOMY    . TONSILLECTOMY     No Known Allergies Prior to Admission medications   Medication Sig Start Date End Date Taking? Authorizing Provider  IRON PO Take 1 tablet by mouth daily.    [provider]  sildenafil (VIAGRA) 100 MG tablet Take 0.5-1 tablets (50-100 mg total) by mouth daily as needed for erectile dysfunction. 05/26/16   McVey, Gelene Mink, PA-C   Social History   Socioeconomic History  . Marital status: Single    Spouse name: Not on file  . Number of children: Not on file  . Years of education: Not on file  . Highest education level: Not on file  Occupational History  . Not on file  Social Needs  . Financial resource strain: Not on file  . Food insecurity:    Worry: Not on file    Inability: Not on file  . Transportation needs:    Medical: Not on file    Non-medical: Not on file  Tobacco Use  . Smoking status: Never Smoker  . Smokeless tobacco: Never Used  Substance and Sexual Activity  . Alcohol use: Yes    Alcohol/week: 0.5 oz    Types: 1 Standard drinks or equivalent per week  . Drug use: No  . Sexual  activity: Never  Lifestyle  . Physical activity:    Days per week: Not on file    Minutes per session: Not on file  . Stress: Not on file  Relationships  . Social connections:    Talks on phone: Not on file    Gets together: Not on file    Attends religious service: Not on file    Active member of club or organization: Not on file    Attends meetings of clubs or organizations: Not on file    Relationship status: Not on file  . Intimate partner violence:    Fear of current or ex partner: Not on file    Emotionally abused: Not on file    Physically abused: Not on file    Forced sexual activity: Not on file  Other Topics Concern  . Not on file    Social History Narrative  . Not on file   Review of Systems  HENT: Negative for hearing loss.   Eyes: Negative for visual disturbance.  Respiratory: Positive for shortness of breath (on exertion, unchanged).   Cardiovascular: Negative for chest pain.  Skin:       + lesion of scalp  Neurological: Negative for dizziness and light-headedness.      Objective:   Physical Exam  Constitutional: He is oriented to person, place, and time. He appears well-developed and well-nourished. No distress.  HENT:  Head: Normocephalic and atraumatic.  L temporal scalp: ~ 4 cm lateral to midline, slight scaly area, overall fairly flat measures 1 cm x 6 mm. No surrounding erythema. No discharge.  Eyes: Pupils are equal, round, and reactive to light. Conjunctivae and EOM are normal.  Neck: Neck supple. No JVD present. Carotid bruit is not present. No tracheal deviation present.  Cardiovascular: Normal rate, regular rhythm and normal heart sounds.  No murmur heard. Pulmonary/Chest: Effort normal and breath sounds normal. No respiratory distress. He has no rales.  Musculoskeletal: Normal range of motion. He exhibits no edema.  Neurological: He is alert and oriented to person, place, and time.  Skin: Skin is warm and dry.  Psychiatric: He has a normal mood and affect. His behavior is normal.  Nursing note and vitals reviewed.  Vitals:   09/27/17 1025  BP: 123/79  Pulse: (!) 59  Temp: 97.6 F (36.4 C)  TempSrc: Oral  SpO2: 99%  Weight: 137 lb 9.6 oz (62.4 kg)  Height: 5\' 9"  (1.753 m)      Assessment & Plan:   Jacob Logan is a 67 y.o. male Skin lesion of scalp - Plan: Ambulatory referral to Dermatology  -Differential includes squamous cell, refer to Derm for evaluation/biopsy.  Erectile dysfunction, unspecified erectile dysfunction type - Plan: sildenafil (VIAGRA) 100 MG tablet  -viagra Rx given - use lowest effective dose. Side effects discussed (including but not limited to  headache/flushing, blue discoloration of vision, possible vascular steal and risk of cardiac effects if underlying unknown coronary artery disease, and permanent sensorineural hearing loss). Understanding expressed.  -Plan for follow-up to discuss dyspnea further. Previous stress testing reassuring.   RTC/ER precautions.  Need for prophylactic vaccination against Streptococcus pneumoniae (pneumococcus) - Plan: Pneumococcal conjugate vaccine 13-valent IM given  Screen for colon cancer - Plan: Ambulatory referral to Gastroenterology   Meds ordered this encounter  Medications  . sildenafil (VIAGRA) 100 MG tablet    Sig: Take 0.5-1 tablets (50-100 mg total) by mouth daily as needed for erectile dysfunction.    Dispense:  5 tablet  Refill:  2   Patient Instructions   I refilled the sildenafil, but do not take that medicine if any lightheadedness, dizziness, shortness of breath or chest pains. Use lowest effective dose.   I referred you to dermatology for the scalp lesion.   Please return to discuss the shortness of breath further, but last stress test and heart evaluation was reassuring.Return to the clinic or go to the nearest emergency room if any of your symptoms worsen or new symptoms occur.  Additionally I would like you to schedule a physical,  but will refer you to gastroenterology to discuss a repeat screening colonoscopy and the cost involved. 1st dose of the pneumonia vaccine series was also given today.   Thanks for coming in today.    IF you received an x-ray today, you will receive an invoice from [facility]. Please contact [facility] at [phone] with questions or concerns regarding your invoice.   IF you received labwork today, you will receive an invoice from [facility]. Please contact [facility] at [phone] with questions or concerns regarding your invoice.   Our billing staff will not be able to assist you with questions regarding bills from these  companies.  You will be contacted with the lab results as soon as they are available. The fastest way to get your results is to activate your My Chart account. Instructions are located on the last page of this paperwork. If you have not heard from [place] regarding the results in 2 weeks, please contact this office.       I personally performed the services described in this documentation, which was scribed in my presence. The recorded information has been reviewed and considered for accuracy and completeness, addended by me as needed, and agree with information above.  Signed,   [name], MD [facility] [place].  09/28/17 11:23 PM

## 2017-09-28 ENCOUNTER — Encounter: Payer: Self-pay | Admitting: Family Medicine

## 2017-10-15 ENCOUNTER — Telehealth: Payer: Self-pay

## 2017-10-15 NOTE — Telephone Encounter (Signed)
Sildenafil prior auth approved. Pt advised.

## 2017-10-18 ENCOUNTER — Encounter: Payer: Self-pay | Admitting: Family Medicine

## 2017-10-18 ENCOUNTER — Ambulatory Visit (INDEPENDENT_AMBULATORY_CARE_PROVIDER_SITE_OTHER): Payer: Commercial Managed Care - PPO | Admitting: Family Medicine

## 2017-10-18 VITALS — BP 108/66 | HR 69 | Temp 97.6°F | Ht 68.5 in | Wt 137.8 lb

## 2017-10-18 DIAGNOSIS — R0609 Other forms of dyspnea: Secondary | ICD-10-CM

## 2017-10-18 DIAGNOSIS — Z13220 Encounter for screening for lipoid disorders: Secondary | ICD-10-CM

## 2017-10-18 DIAGNOSIS — Z125 Encounter for screening for malignant neoplasm of prostate: Secondary | ICD-10-CM

## 2017-10-18 DIAGNOSIS — Z130 Encounter for screening for diseases of the blood and blood-forming organs and certain disorders involving the immune mechanism: Secondary | ICD-10-CM | POA: Diagnosis not present

## 2017-10-18 DIAGNOSIS — Z0000 Encounter for general adult medical examination without abnormal findings: Secondary | ICD-10-CM | POA: Diagnosis not present

## 2017-10-18 DIAGNOSIS — Z131 Encounter for screening for diabetes mellitus: Secondary | ICD-10-CM

## 2017-10-18 DIAGNOSIS — Z23 Encounter for immunization: Secondary | ICD-10-CM | POA: Diagnosis not present

## 2017-10-18 DIAGNOSIS — Z1211 Encounter for screening for malignant neoplasm of colon: Secondary | ICD-10-CM | POA: Diagnosis not present

## 2017-10-18 DIAGNOSIS — R42 Dizziness and giddiness: Secondary | ICD-10-CM | POA: Diagnosis not present

## 2017-10-18 DIAGNOSIS — R6889 Other general symptoms and signs: Secondary | ICD-10-CM | POA: Diagnosis not present

## 2017-10-18 DIAGNOSIS — R350 Frequency of micturition: Secondary | ICD-10-CM | POA: Diagnosis not present

## 2017-10-18 DIAGNOSIS — Z136 Encounter for screening for cardiovascular disorders: Secondary | ICD-10-CM

## 2017-10-18 DIAGNOSIS — K649 Unspecified hemorrhoids: Secondary | ICD-10-CM | POA: Diagnosis not present

## 2017-10-18 LAB — POCT URINALYSIS DIP (MANUAL ENTRY)
BILIRUBIN UA: NEGATIVE
GLUCOSE UA: NEGATIVE mg/dL
Leukocytes, UA: NEGATIVE
NITRITE UA: NEGATIVE
PH UA: 6.5 (ref 5.0–8.0)
Protein Ur, POC: NEGATIVE mg/dL
RBC UA: NEGATIVE
SPEC GRAV UA: 1.025 (ref 1.010–1.025)
Urobilinogen, UA: 0.2 E.U./dL

## 2017-10-18 MED ORDER — ZOSTER VAC RECOMB ADJUVANTED 50 MCG/0.5ML IM SUSR: 0.5000 mL | Freq: Once | INTRAMUSCULAR | 1 refills | Status: AC

## 2017-10-18 NOTE — Progress Notes (Signed)
Subjective:  By signing my name below, I, Essence Howell, attest that this documentation has been prepared under the direction and in the presence of [name], MD Electronically Signed: Ladene Artist, ED Scribe 10/18/2017 at 9:54 AM.   Patient ID: [name], male    DOB: 30-May-1951, 67 y.o.   MRN: [id number]  Chief Complaint  Patient presents with  . Annual Exam    CPE (pt stated that he woke up dizzy a few days ago and stayed dizzy all day)   HPI Tamarcus Condie is a 67 y.o. male who presents to Primary Care at [facility] for an annual exam. H/o CKD, ED, remote h/o osteomyelitis of cervical spine in 2016.  CKD Lab Results  Component Value Date   CREATININE 1.03 03/29/2015  Has not had recent blood work done but kidney function was normal at that time.  ED Prescribed Viagra at 4/18 visit. Tolerating meds without new side-effects at that time.  CA Screening Colonoscopy: due, last colonoscopy 14 yrs ago Prostate CA Screening: pt agrees to DRE and PSA today Lab Results  Component Value Date   PSA 1.03 03/29/2015   PSA 0.85 11/17/2014   PSA 2.12 11/03/2014  Skin CA Screening: pt has an upcoming appointment with dermatology towards the end of the month for a dry scalp lesion  Immunizations Immunization History  Administered Date(s) Administered  . Influenza,inj,Quad PF,6+ Mos 03/29/2015, 02/24/2016  . Pneumococcal Conjugate-13 09/27/2017  Shingles: sent to pharmacy; pt has had shingles several yrs ago Tetanus: updated today  Fall Screening No falls within the past yr.  Depression Screening Depression screen [facility] 2/9 10/18/2017 09/27/2017 07/26/2016 05/26/2016 10/23/2015  Decreased Interest 0 0 0 0 0  Down, Depressed, Hopeless 0 0 0 0 0  PHQ - 2 Score 0 0 0 0 0  Altered sleeping - - - - -  Tired, decreased energy - - - - -  Change in appetite - - - - -  Feeling bad or failure about yourself  - - - - -  Trouble concentrating - - - - -  Moving slowly or fidgety/restless -  - - - -  Suicidal thoughts - - - - -  PHQ-9 Score - - - - -  Difficult doing work/chores - - - - -   Functional Status Survey: Is the patient deaf or have difficulty hearing?: No Does the patient have difficulty seeing, even when wearing glasses/contacts?: Yes Does the patient have difficulty concentrating, remembering, or making decisions?: No Does the patient have difficulty walking or climbing stairs?: Yes Does the patient have difficulty dressing or bathing?: No Does the patient have difficulty doing errands alone such as visiting a doctor's office or shopping?: No Vision: Last eye exam ~7 yrs ago, attributes eye redness to this Walking/climbing stairs: unchanged sob at the top of climbing 4 flights of stairs, first noticed symptoms ~3 yrs ago when he had a stress test  Mental Status Screening 6CIT Screen 10/18/2017  What Year? 0 points  What month? 0 points  What time? 0 points  Count back from 20 0 points  Months in reverse 0 points  Repeat phrase 0 points  Total Score 0    Visual Acuity Screening   Right eye Left eye Both eyes  Without correction:     With correction: 20/20-1 20/20 20/20   Vision: Last eye exam ~7 yrs ago Dentist: Not followed by a dentist. Pt does report a few broken teeth with occasional swelling. Denies abscess.  Exercise: walks for exercise  Advanced Directives Does not have any advanced directives. Paperwork was provided.  Dizziness Pt reports intermittent dizziness that occurs at least twice/yr. States dizziness returned 2 days ago and lasted for the entire day, has resolved now. Pt describes dizziness as a "spinning" sensation that is exacerbated with changing the position of his head and bending. No treatments tried. Denies dizziness at this time, cp, sob, weakness in extremities, slurred speech with dizziness.  SOB Pt reports some sob with running for a few yrs. He denies changes in sob but states he has stopped running. Pt Hhad a stress text in  Nov 2016. Low risk, normal heart function at rest and during stress, EF 59%, no ischemia.  H/o Hemorrhoids and Anal Bleeding Pt presents with some anal bleeding and mucus-like discharge. States he develops a "diaper rash" if the area is not dry. Denies rectal pain or itching, constipation. Pt reports a h/o internal hemorrhoids. Last colonoscopy was ~14 yrs ago, normal.  Urinary Frequency Pt reports daytime urinary frequency for yrs. Reports he urinates every 2 hrs but in small amounts. Only wakes 1-2 times at night to urinate. Denies dysuria, hematuria. Reports a h/o slightly enlarged prostate. Last saw urology 6-7 yrs ago for kidney stones. No family h/o prostate CA or bladder issues.  Cold Intolerance Pt states fingertips become white and numb in cold temperatures.  Patient Active Problem List   Diagnosis Date Noted  . Dyspnea on exertion, possible anginal equivalent 04/07/2015  . CKD (chronic kidney disease), stage III ([place]) 11/22/2014  . Renal stone 11/19/2014  . Malnutrition of moderate degree ([place]) 11/19/2014  . Scoliosis (and kyphoscoliosis), idiopathic 10/18/2014   Past Medical History:  Diagnosis Date  . Anemia   . Kidney stone   . Malnourished ([place])   . Osteomyelitis of cervical spine ([place]) 11/2014   Past Surgical History:  Procedure Laterality Date  . KIDNEY STONE SURGERY    . TONSILLECTOMY    . TONSILLECTOMY     No Known Allergies Prior to Admission medications   Medication Sig Start Date End Date Taking? Authorizing Provider  sildenafil (VIAGRA) 100 MG tablet Take 0.5-1 tablets (50-100 mg total) by mouth daily as needed for erectile dysfunction. 09/27/17   [name], MD   Social History   Socioeconomic History  . Marital status: Single    Spouse name: Not on file  . Number of children: Not on file  . Years of education: Not on file  . Highest education level: Not on file  Occupational History  . Not on file  Social Needs  . Financial resource strain:  Not on file  . Food insecurity:    Worry: Not on file    Inability: Not on file  . Transportation needs:    Medical: Not on file    Non-medical: Not on file  Tobacco Use  . Smoking status: Never Smoker  . Smokeless tobacco: Never Used  Substance and Sexual Activity  . Alcohol use: Yes    Alcohol/week: 0.5 oz    Types: 1 Standard drinks or equivalent per week  . Drug use: No  . Sexual activity: Never  Lifestyle  . Physical activity:    Days per week: Not on file    Minutes per session: Not on file  . Stress: Not on file  Relationships  . Social connections:    Talks on phone: Not on file    Gets together: Not on file    Attends religious  service: Not on file    Active member of club or organization: Not on file    Attends meetings of clubs or organizations: Not on file    Relationship status: Not on file  . Intimate partner violence:    Fear of current or ex partner: Not on file    Emotionally abused: Not on file    Physically abused: Not on file    Forced sexual activity: Not on file  Other Topics Concern  . Not on file  Social History Narrative  . Not on file   Review of Systems  HENT: Positive for congestion, dental problem and sinus pressure.   Eyes: Positive for redness and visual disturbance.  Respiratory: Positive for shortness of breath (unchanged).   Cardiovascular: Negative for chest pain.  Gastrointestinal: Positive for anal bleeding. Negative for constipation and rectal pain.  Endocrine: Positive for cold intolerance.  Genitourinary: Positive for frequency. Negative for dysuria and hematuria.  Skin: Positive for rash.  Neurological: Positive for dizziness. Negative for speech difficulty and weakness.      Objective:   Physical Exam  Constitutional: He is oriented to person, place, and time. He appears well-developed and well-nourished.  HENT:  Head: Normocephalic and atraumatic.  Right Ear: External ear normal.  Left Ear: External ear normal.    Mouth/Throat: Oropharynx is clear and moist.  Eyes: Pupils are equal, round, and reactive to light. Conjunctivae and EOM are normal.  Neck: Normal range of motion. Neck supple. No thyroid mass and no thyromegaly present.  No appreciable thyroid nodes or enlargement  Cardiovascular: Normal rate, regular rhythm, normal heart sounds and intact distal pulses.  Pulmonary/Chest: Effort normal and breath sounds normal. No respiratory distress. He has no wheezes.  Abdominal: Soft. He exhibits no distension. There is no tenderness. Hernia confirmed negative in the right inguinal area and confirmed negative in the left inguinal area.  Genitourinary: Rectal exam shows external hemorrhoid.  Genitourinary Comments: Rectum exam: Firm hemorrhoid at 11 o'clock. No bleeding. Prostate exam: Somewhat firm. No apparent nodules.  Musculoskeletal: Normal range of motion. He exhibits no edema or tenderness.  Lymphadenopathy:    He has no cervical adenopathy.  Neurological: He is alert and oriented to person, place, and time. He has normal reflexes.  Skin: Skin is warm and dry.  Psychiatric: He has a normal mood and affect. His behavior is normal.  Vitals reviewed.  Vitals:   10/18/17 0914  BP: 108/66  Pulse: 69  Temp: 97.6 F (36.4 C)  TempSrc: Oral  SpO2: 98%  Weight: 137 lb 12.8 oz (62.5 kg)  Height: 5' 8.5" (1.74 m)   EKG reading done by [name], MD: Sinus bradycardia rate 50. Inverted P wave in II, III, AVF. Flat in other leads. No apparent ST/T changes. Rate stable from EKG in 2016 when rate was 47.    Assessment & Plan:    Jaysten Essner is a 67 y.o. male Annual physical exam  - -anticipatory guidance as below in AVS, screening labs above. Health maintenance items as above in HPI discussed/recommended as applicable.   Dizziness - Plan: CBC, Comprehensive metabolic panel, EKG 43-PIRJ, TSH  - possible peripheral vertigo based on description. No concerning findings on exam. Trial of  meclizine, rtc precautions if worsening.   Screening for cardiovascular condition - Plan: EKG 12-Lead  Screening for hyperlipidemia - Plan: Lipid panel  Screening for diabetes mellitus  Screening, anemia, deficiency, iron  Screening for malignant neoplasm of prostate - Plan: PSA Urinary  frequency - Plan: PSA, POCT urinalysis dipstick, POCT Microscopic Urinalysis ([facility])  -check PSA, then discuss further at follow up. May need urology eval.   DOE (dyspnea on exertion)  -longstanding, without recent worsening. Prior stress testing reassuring. Rtc/ER precautions if changes.   Hemorrhoids, unspecified hemorrhoid type - Plan: Ambulatory referral to Gastroenterology Screen for colon cancer - Plan: Ambulatory referral to Gastroenterology  - may have component of pruritus ani from hemorrhoid. Overdue for colonoscopy. Refer to GI.   Cold intolerance - Plan: TSH  - start with thyroid testing and plan to follow up to discuss further. Has some symptoms that may be Raynuads as well.   Need for shingles vaccine - Plan: Zoster Vaccine Adjuvanted [facility]) injection sent to pharmacy.   Need for Tdap vaccination - Plan: Tdap vaccine greater than or equal to 7yo IM   Meds ordered this encounter  Medications  . Zoster Vaccine Adjuvanted [facility]) injection    Sig: Inject 0.5 mLs into the muscle once for 1 dose. Repeat in 2-6 months.    Dispense:  0.5 mL    Refill:  1   Patient Instructions    Dizziness may be vertigo.  You can try meclizine over the counter next time if that occurs, but if that becomes more frequent or not improving within a day please return to discuss further. Return to the clinic or go to the nearest emergency room if any of your symptoms worsen or new symptoms occur.  For dental problems - I would recommend meeting with dentist as soon as possible as the decay can put you at risk for abscess/infection.   For vision issues, please discuss those with eye care provider as  other testing may be needed.  Please schedule appointment as soon as possible.   For hemorrhoids, see info below but I would like you to meet with gastroenterologist to discuss testing for colon cancer and evaluation of wetness feeling in anal area. I do see what appears to be a hemorrhoid on exam today, but that should be evaluated by gastroenterology.   For cold intolerance and fingertip symptoms, I will check a thyroid test to begin with, but follow up in next few weeks to discuss those symptoms further.   For frequent urination, I will check a prostate test and urine test today, but will also need to discuss those symptoms further at follow up visit. You may need to follow up again with urology.    Preventive Care 21 Years and Older, Male Preventive care refers to lifestyle choices and visits with your health care provider that can promote health and wellness. What does preventive care include?  A yearly physical exam. This is also called an annual well check.  Dental exams once or twice a year.  Routine eye exams. Ask your health care provider how often you should have your eyes checked.  Personal lifestyle choices, including: ? Daily care of your teeth and gums. ? Regular physical activity. ? Eating a healthy diet. ? Avoiding tobacco and drug use. ? Limiting alcohol use. ? Practicing safe sex. ? Taking low doses of aspirin every day. ? Taking vitamin and mineral supplements as recommended by your health care provider. What happens during an annual well check? The services and screenings done by your health care provider during your annual well check will depend on your age, overall health, lifestyle risk factors, and family history of disease. Counseling Your health care provider may ask you questions about your:  Alcohol use.  Tobacco use.  Drug use.  Emotional well-being.  Home and relationship well-being.  Sexual activity.  Eating habits.  History of  falls.  Memory and ability to understand (cognition).  Work and work Statistician.  Screening You may have the following tests or measurements:  Height, weight, and BMI.  Blood pressure.  Lipid and cholesterol levels. These may be checked every 5 years, or more frequently if you are over 83 years old.  Skin check.  Lung cancer screening. You may have this screening every year starting at age 24 if you have a 30-pack-year history of smoking and currently smoke or have quit within the past 15 years.  Fecal occult blood test (FOBT) of the stool. You may have this test every year starting at age 42.  Flexible sigmoidoscopy or colonoscopy. You may have a sigmoidoscopy every 5 years or a colonoscopy every 10 years starting at age 53.  Prostate cancer screening. Recommendations will vary depending on your family history and other risks.  Hepatitis C blood test.  Hepatitis B blood test.  Sexually transmitted disease (STD) testing.  Diabetes screening. This is done by checking your blood sugar (glucose) after you have not eaten for a while (fasting). You may have this done every 1-3 years.  Abdominal aortic aneurysm (AAA) screening. You may need this if you are a current or former smoker.  Osteoporosis. You may be screened starting at age 25 if you are at high risk.  Talk with your health care provider about your test results, treatment options, and if necessary, the need for more tests. Vaccines Your health care provider may recommend certain vaccines, such as:  Influenza vaccine. This is recommended every year.  Tetanus, diphtheria, and acellular pertussis (Tdap, Td) vaccine. You may need a Td booster every 10 years.  Varicella vaccine. You may need this if you have not been vaccinated.  Zoster vaccine. You may need this after age 31.  Measles, mumps, and rubella (MMR) vaccine. You may need at least one dose of MMR if you were born in 1957 or later. You may also need a second  dose.  Pneumococcal 13-valent conjugate (PCV13) vaccine. One dose is recommended after age 3.  Pneumococcal polysaccharide (PPSV23) vaccine. One dose is recommended after age 31.  Meningococcal vaccine. You may need this if you have certain conditions.  Hepatitis A vaccine. You may need this if you have certain conditions or if you travel or work in places where you may be exposed to hepatitis A.  Hepatitis B vaccine. You may need this if you have certain conditions or if you travel or work in places where you may be exposed to hepatitis B.  Haemophilus influenzae type b (Hib) vaccine. You may need this if you have certain risk factors.  Talk to your health care provider about which screenings and vaccines you need and how often you need them. This information is not intended to replace advice given to you by your health care provider. Make sure you discuss any questions you have with your health care provider. Document Released: 06/25/2015 Document Revised: 02/16/2016 Document Reviewed: 03/30/2015 Elsevier Interactive Patient Education  2018 [employer].     Urinary Frequency, Adult Urinary frequency means urinating more often than usual. People with urinary frequency urinate at least 8 times in 24 hours, even if they drink a normal amount of fluid. Although they urinate more often than normal, the total amount of urine produced in a day may be normal. Urinary frequency is also  called pollakiuria. What are the causes? This condition may be caused by:  A urinary tract infection.  Obesity.  Bladder problems, such as bladder stones.  Caffeine or alcohol.  Eating food or drinking fluids that irritate the bladder. These include coffee, tea, soda, artificial sweeteners, citrus, tomato-based foods, and chocolate.  Certain medicines, such as medicines that help the body get rid of extra fluid (diuretics).  Muscle or nerve weakness.  Overactive bladder.  Chronic  diabetes.  Interstitial cystitis.  In men, problems with the prostate, such as an enlarged prostate.  In women, pregnancy.  In some cases, the cause may not be known. What increases the risk? This condition is more likely to develop in:  Women who have gone through menopause.  Men with prostate problems.  People with a disease or injury that affects the nerves or spinal cord.  People who have or have had a condition that affects the brain, such as a stroke.  What are the signs or symptoms? Symptoms of this condition include:  Feeling an urgent need to urinate often. The stress and anxiety of needing to find a bathroom quickly can make this urge worse.  Urinating 8 or more times in 24 hours.  Urinating as often as every 1 to 2 hours.  How is this diagnosed? This condition is diagnosed based on your symptoms, your medical history, and a physical exam. You may have tests, such as:  Blood tests.  Urine tests.  Imaging tests, such as X-rays or ultrasounds.  A bladder test.  A test of your neurological system. This is the body system that senses the need to urinate.  A test to check for problems in the urethra and bladder called cystoscopy.  You may also be asked to keep a bladder diary. A bladder diary is a record of what you eat and drink, how often you urinate, and how much you urinate. You may need to see a health care provider who specializes in conditions of the urinary tract (urologist) or kidneys (nephrologist). How is this treated? Treatment for this condition depends on the cause. Sometimes the condition goes away on its own and treatment is not necessary. If treatment is needed, it may include:  Taking medicine.  Learning exercises that strengthen the muscles that help control urination.  Following a bladder training program. This may include: ? Learning to delay going to the bathroom. ? Double urinating (voiding). This helps if you are not completely  emptying your bladder. ? Scheduled voiding.  Making diet changes, such as: ? Avoiding caffeine. ? Drinking fewer fluids, especially alcohol. ? Not drinking in the evening. ? Not having foods or drinks that may irritate the bladder. ? Eating foods that help prevent or ease constipation. Constipation can make this condition worse.  Having the nerves in your bladder stimulated. There are two options for stimulating the nerves to your bladder: ? Outpatient electrical nerve stimulation. This is done by your health care provider. ? Surgery to implant a bladder pacemaker. The pacemaker helps to control the urge to urinate.  Follow these instructions at home:  Keep a bladder diary if told to by your health care provider.  Take over-the-counter and prescription medicines only as told by your health care provider.  Do any exercises as told by your health care provider.  Follow a bladder training program as told by your health care provider.  Make any recommended diet changes.  Keep all follow-up visits as told by your health care provider. This  is important. Contact a health care provider if:  You start urinating more often.  You feel pain or irritation when you urinate.  You notice blood in your urine.  Your urine looks cloudy.  You develop a fever.  You begin vomiting. Get help right away if:  You are unable to urinate. This information is not intended to replace advice given to you by your health care provider. Make sure you discuss any questions you have with your health care provider. Document Released: 03/25/2009 Document Revised: 06/30/2015 Document Reviewed: 12/23/2014 Elsevier Interactive Patient Education  2018 [employer].   Hemorrhoids Hemorrhoids are swollen veins in and around the rectum or anus. There are two types of hemorrhoids:  Internal hemorrhoids. These occur in the veins that are just inside the rectum. They may poke through to the outside and become  irritated and painful.  External hemorrhoids. These occur in the veins that are outside of the anus and can be felt as a painful swelling or hard lump near the anus.  Most hemorrhoids do not cause serious problems, and they can be managed with home treatments such as diet and lifestyle changes. If home treatments do not help your symptoms, procedures can be done to shrink or remove the hemorrhoids. What are the causes? This condition is caused by increased pressure in the anal area. This pressure may result from various things, including:  Constipation.  Straining to have a bowel movement.  Diarrhea.  Pregnancy.  Obesity.  Sitting for long periods of time.  Heavy lifting or other activity that causes you to strain.  Anal sex.  What are the signs or symptoms? Symptoms of this condition include:  Pain.  Anal itching or irritation.  Rectal bleeding.  Leakage of stool (feces).  Anal swelling.  One or more lumps around the anus.  How is this diagnosed? This condition can often be diagnosed through a visual exam. Other exams or tests may also be done, such as:  Examination of the rectal area with a gloved hand (digital rectal exam).  Examination of the anal canal using a small tube (anoscope).  A blood test, if you have lost a significant amount of blood.  A test to look inside the colon (sigmoidoscopy or colonoscopy).  How is this treated? This condition can usually be treated at home. However, various procedures may be done if dietary changes, lifestyle changes, and other home treatments do not help your symptoms. These procedures can help make the hemorrhoids smaller or remove them completely. Some of these procedures involve surgery, and others do not. Common procedures include:  Rubber band ligation. Rubber bands are placed at the base of the hemorrhoids to cut off the blood supply to them.  Sclerotherapy. Medicine is injected into the hemorrhoids to shrink  them.  Infrared coagulation. A type of light energy is used to get rid of the hemorrhoids.  Hemorrhoidectomy surgery. The hemorrhoids are surgically removed, and the veins that supply them are tied off.  Stapled hemorrhoidopexy surgery. A circular stapling device is used to remove the hemorrhoids and use staples to cut off the blood supply to them.  Follow these instructions at home: Eating and drinking  Eat foods that have a lot of fiber in them, such as whole grains, beans, nuts, fruits, and vegetables. Ask your health care provider about taking products that have added fiber (fiber supplements).  Drink enough fluid to keep your urine clear or pale yellow. Managing pain and swelling  Take warm sitz baths  for 20 minutes, 3-4 times a day to ease pain and discomfort.  If directed, apply ice to the affected area. Using ice packs between sitz baths may be helpful. ? Put ice in a plastic bag. ? Place a towel between your skin and the bag. ? Leave the ice on for 20 minutes, 2-3 times a day. General instructions  Take over-the-counter and prescription medicines only as told by your health care provider.  Use medicated creams or suppositories as told.  Exercise regularly.  Go to the bathroom when you have the urge to have a bowel movement. Do not wait.  Avoid straining to have bowel movements.  Keep the anal area dry and clean. Use wet toilet paper or moist towelettes after a bowel movement.  Do not sit on the toilet for long periods of time. This increases blood pooling and pain. Contact a health care provider if:  You have increasing pain and swelling that are not controlled by treatment or medicine.  You have uncontrolled bleeding.  You have difficulty having a bowel movement, or you are unable to have a bowel movement.  You have pain or inflammation outside the area of the hemorrhoids. This information is not intended to replace advice given to you by your health care  provider. Make sure you discuss any questions you have with your health care provider. Document Released: 05/26/2000 Document Revised: 10/27/2015 Document Reviewed: 02/10/2015 Elsevier Interactive Patient Education  2018 [employer].      Vertigo Vertigo is the feeling that you or your surroundings are moving when they are not. Vertigo can be dangerous if it occurs while you are doing something that could endanger you or others, such as driving. What are the causes? This condition is caused by a disturbance in the signals that are sent by your body's sensory systems to your brain. Different causes of a disturbance can lead to vertigo, including:  Infections, especially in the inner ear.  A bad reaction to a drug, or misuse of alcohol and medicines.  Withdrawal from drugs or alcohol.  Quickly changing positions, as when lying down or rolling over in bed.  Migraine headaches.  Decreased blood flow to the brain.  Decreased blood pressure.  Increased pressure in the brain from a head or neck injury, stroke, infection, tumor, or bleeding.  Central nervous system disorders.  What are the signs or symptoms? Symptoms of this condition usually occur when you move your head or your eyes in different directions. Symptoms may start suddenly, and they usually last for less than a minute. Symptoms may include:  Loss of balance and falling.  Feeling like you are spinning or moving.  Feeling like your surroundings are spinning or moving.  Nausea and vomiting.  Blurred vision or double vision.  Difficulty hearing.  Slurred speech.  Dizziness.  Involuntary eye movement (nystagmus).  Symptoms can be mild and cause only slight annoyance, or they can be severe and interfere with daily life. Episodes of vertigo may return (recur) over time, and they are often triggered by certain movements. Symptoms may improve over time. How is this diagnosed? This condition may be diagnosed based  on medical history and the quality of your nystagmus. Your health care provider may test your eye movements by asking you to quickly change positions to trigger the nystagmus. This may be called the Dix-Hallpike test, head thrust test, or roll test. You may be referred to a health care provider who specializes in ear, nose, and throat (ENT) problems (  otolaryngologist) or a provider who specializes in disorders of the central nervous system (neurologist). You may have additional testing, including:  A physical exam.  Blood tests.  MRI.  A CT scan.  An electrocardiogram (ECG). This records electrical activity in your heart.  An electroencephalogram (EEG). This records electrical activity in your brain.  Hearing tests.  How is this treated? Treatment for this condition depends on the cause and the severity of the symptoms. Treatment options include:  Medicines to treat nausea or vertigo. These are usually used for severe cases. Some medicines that are used to treat other conditions may also reduce or eliminate vertigo symptoms. These include: ? Medicines that control allergies (antihistamines). ? Medicines that control seizures (anticonvulsants). ? Medicines that relieve depression (antidepressants). ? Medicines that relieve anxiety (sedatives).  Head movements to adjust your inner ear back to normal. If your vertigo is caused by an ear problem, your health care provider may recommend certain movements to correct the problem.  Surgery. This is rare.  Follow these instructions at home: Safety  Move slowly.Avoid sudden body or head movements.  Avoid driving.  Avoid operating heavy machinery.  Avoid doing any tasks that would cause danger to you or others if you would have a vertigo episode during the task.  If you have trouble walking or keeping your balance, try using a cane for stability. If you feel dizzy or unstable, sit down right away.  Return to your normal activities as  told by your health care provider. Ask your health care provider what activities are safe for you. General instructions  Take over-the-counter and prescription medicines only as told by your health care provider.  Avoid certain positions or movements as told by your health care provider.  Drink enough fluid to keep your urine clear or pale yellow.  Keep all follow-up visits as told by your health care provider. This is important. Contact a health care provider if:  Your medicines do not relieve your vertigo or they make it worse.  You have a fever.  Your condition gets worse or you develop new symptoms.  Your family or friends notice any behavioral changes.  Your nausea or vomiting gets worse.  You have numbness or a "pins and needles" sensation in part of your body. Get help right away if:  You have difficulty moving or speaking.  You are always dizzy.  You faint.  You develop severe headaches.  You have weakness in your hands, arms, or legs.  You have changes in your hearing or vision.  You develop a stiff neck.  You develop sensitivity to light. This information is not intended to replace advice given to you by your health care provider. Make sure you discuss any questions you have with your health care provider. Document Released: 03/08/2005 Document Revised: 11/10/2015 Document Reviewed: 09/21/2014 Elsevier Interactive Patient Education  2018 [employer].    IF you received an x-ray today, you will receive an invoice from [facility]. Please contact [facility] at [phone] with questions or concerns regarding your invoice.   IF you received labwork today, you will receive an invoice from [facility]. Please contact [facility] at [phone] with questions or concerns regarding your invoice.   Our billing staff will not be able to assist you with questions regarding bills from these companies.  You will be contacted with the lab results as  soon as they are available. The fastest way to get your results is to activate your My Chart account. Instructions are  located on the last page of this paperwork. If you have not heard from [place] regarding the results in 2 weeks, please contact this office.     \ I personally performed the services described in this documentation, which was scribed in my presence. The recorded information has been reviewed and considered for accuracy and completeness, addended by me as needed, and agree with information above.  Signed,   [name], MD [facility].  10/20/17 8:34 AM

## 2017-10-18 NOTE — Patient Instructions (Addendum)
Dizziness may be vertigo.  You can try meclizine over the counter next time if that occurs, but if that becomes more frequent or not improving within a day please return to discuss further. Return to the clinic or go to the nearest emergency room if any of your symptoms worsen or new symptoms occur.  For dental problems - I would recommend meeting with dentist as soon as possible as the decay can put you at risk for abscess/infection.   For vision issues, please discuss those with eye care provider as other testing may be needed.  Please schedule appointment as soon as possible.   For hemorrhoids, see info below but I would like you to meet with gastroenterologist to discuss testing for colon cancer and evaluation of wetness feeling in anal area. I do see what appears to be a hemorrhoid on exam today, but that should be evaluated by gastroenterology.   For cold intolerance and fingertip symptoms, I will check a thyroid test to begin with, but follow up in next few weeks to discuss those symptoms further.   For frequent urination, I will check a prostate test and urine test today, but will also need to discuss those symptoms further at follow up visit. You may need to follow up again with urology.    Preventive Care 48 Years and Older, Male Preventive care refers to lifestyle choices and visits with your health care provider that can promote health and wellness. What does preventive care include?  A yearly physical exam. This is also called an annual well check.  Dental exams once or twice a year.  Routine eye exams. Ask your health care provider how often you should have your eyes checked.  Personal lifestyle choices, including: ? Daily care of your teeth and gums. ? Regular physical activity. ? Eating a healthy diet. ? Avoiding tobacco and drug use. ? Limiting alcohol use. ? Practicing safe sex. ? Taking low doses of aspirin every day. ? Taking vitamin and mineral supplements as  recommended by your health care provider. What happens during an annual well check? The services and screenings done by your health care provider during your annual well check will depend on your age, overall health, lifestyle risk factors, and family history of disease. Counseling Your health care provider may ask you questions about your:  Alcohol use.  Tobacco use.  Drug use.  Emotional well-being.  Home and relationship well-being.  Sexual activity.  Eating habits.  History of falls.  Memory and ability to understand (cognition).  Work and work Statistician.  Screening You may have the following tests or measurements:  Height, weight, and BMI.  Blood pressure.  Lipid and cholesterol levels. These may be checked every 5 years, or more frequently if you are over 63 years old.  Skin check.  Lung cancer screening. You may have this screening every year starting at age 61 if you have a 30-pack-year history of smoking and currently smoke or have quit within the past 15 years.  Fecal occult blood test (FOBT) of the stool. You may have this test every year starting at age 38.  Flexible sigmoidoscopy or colonoscopy. You may have a sigmoidoscopy every 5 years or a colonoscopy every 10 years starting at age 87.  Prostate cancer screening. Recommendations will vary depending on your family history and other risks.  Hepatitis C blood test.  Hepatitis B blood test.  Sexually transmitted disease (STD) testing.  Diabetes screening. This is done by checking your blood sugar (  glucose) after you have not eaten for a while (fasting). You may have this done every 1-3 years.  Abdominal aortic aneurysm (AAA) screening. You may need this if you are a current or former smoker.  Osteoporosis. You may be screened starting at age 86 if you are at high risk.  Talk with your health care provider about your test results, treatment options, and if necessary, the need for more  tests. Vaccines Your health care provider may recommend certain vaccines, such as:  Influenza vaccine. This is recommended every year.  Tetanus, diphtheria, and acellular pertussis (Tdap, Td) vaccine. You may need a Td booster every 10 years.  Varicella vaccine. You may need this if you have not been vaccinated.  Zoster vaccine. You may need this after age 67.  Measles, mumps, and rubella (MMR) vaccine. You may need at least one dose of MMR if you were born in 1957 or later. You may also need a second dose.  Pneumococcal 13-valent conjugate (PCV13) vaccine. One dose is recommended after age 61.  Pneumococcal polysaccharide (PPSV23) vaccine. One dose is recommended after age 65.  Meningococcal vaccine. You may need this if you have certain conditions.  Hepatitis A vaccine. You may need this if you have certain conditions or if you travel or work in places where you may be exposed to hepatitis A.  Hepatitis B vaccine. You may need this if you have certain conditions or if you travel or work in places where you may be exposed to hepatitis B.  Haemophilus influenzae type b (Hib) vaccine. You may need this if you have certain risk factors.  Talk to your health care provider about which screenings and vaccines you need and how often you need them. This information is not intended to replace advice given to you by your health care provider. Make sure you discuss any questions you have with your health care provider. Document Released: 06/25/2015 Document Revised: 02/16/2016 Document Reviewed: 03/30/2015 Elsevier Interactive Patient Education  2018 [employer].     Urinary Frequency, Adult Urinary frequency means urinating more often than usual. People with urinary frequency urinate at least 8 times in 24 hours, even if they drink a normal amount of fluid. Although they urinate more often than normal, the total amount of urine produced in a day may be normal. Urinary frequency is also  called pollakiuria. What are the causes? This condition may be caused by:  A urinary tract infection.  Obesity.  Bladder problems, such as bladder stones.  Caffeine or alcohol.  Eating food or drinking fluids that irritate the bladder. These include coffee, tea, soda, artificial sweeteners, citrus, tomato-based foods, and chocolate.  Certain medicines, such as medicines that help the body get rid of extra fluid (diuretics).  Muscle or nerve weakness.  Overactive bladder.  Chronic diabetes.  Interstitial cystitis.  In men, problems with the prostate, such as an enlarged prostate.  In women, pregnancy.  In some cases, the cause may not be known. What increases the risk? This condition is more likely to develop in:  Women who have gone through menopause.  Men with prostate problems.  People with a disease or injury that affects the nerves or spinal cord.  People who have or have had a condition that affects the brain, such as a stroke.  What are the signs or symptoms? Symptoms of this condition include:  Feeling an urgent need to urinate often. The stress and anxiety of needing to find a bathroom quickly can make this  urge worse.  Urinating 8 or more times in 24 hours.  Urinating as often as every 1 to 2 hours.  How is this diagnosed? This condition is diagnosed based on your symptoms, your medical history, and a physical exam. You may have tests, such as:  Blood tests.  Urine tests.  Imaging tests, such as X-rays or ultrasounds.  A bladder test.  A test of your neurological system. This is the body system that senses the need to urinate.  A test to check for problems in the urethra and bladder called cystoscopy.  You may also be asked to keep a bladder diary. A bladder diary is a record of what you eat and drink, how often you urinate, and how much you urinate. You may need to see a health care provider who specializes in conditions of the urinary tract  (urologist) or kidneys (nephrologist). How is this treated? Treatment for this condition depends on the cause. Sometimes the condition goes away on its own and treatment is not necessary. If treatment is needed, it may include:  Taking medicine.  Learning exercises that strengthen the muscles that help control urination.  Following a bladder training program. This may include: ? Learning to delay going to the bathroom. ? Double urinating (voiding). This helps if you are not completely emptying your bladder. ? Scheduled voiding.  Making diet changes, such as: ? Avoiding caffeine. ? Drinking fewer fluids, especially alcohol. ? Not drinking in the evening. ? Not having foods or drinks that may irritate the bladder. ? Eating foods that help prevent or ease constipation. Constipation can make this condition worse.  Having the nerves in your bladder stimulated. There are two options for stimulating the nerves to your bladder: ? Outpatient electrical nerve stimulation. This is done by your health care provider. ? Surgery to implant a bladder pacemaker. The pacemaker helps to control the urge to urinate.  Follow these instructions at home:  Keep a bladder diary if told to by your health care provider.  Take over-the-counter and prescription medicines only as told by your health care provider.  Do any exercises as told by your health care provider.  Follow a bladder training program as told by your health care provider.  Make any recommended diet changes.  Keep all follow-up visits as told by your health care provider. This is important. Contact a health care provider if:  You start urinating more often.  You feel pain or irritation when you urinate.  You notice blood in your urine.  Your urine looks cloudy.  You develop a fever.  You begin vomiting. Get help right away if:  You are unable to urinate. This information is not intended to replace advice given to you by your  health care provider. Make sure you discuss any questions you have with your health care provider. Document Released: 03/25/2009 Document Revised: 06/30/2015 Document Reviewed: 12/23/2014 Elsevier Interactive Patient Education  2018 [employer].   Hemorrhoids Hemorrhoids are swollen veins in and around the rectum or anus. There are two types of hemorrhoids:  Internal hemorrhoids. These occur in the veins that are just inside the rectum. They may poke through to the outside and become irritated and painful.  External hemorrhoids. These occur in the veins that are outside of the anus and can be felt as a painful swelling or hard lump near the anus.  Most hemorrhoids do not cause serious problems, and they can be managed with home treatments such as diet and lifestyle changes.  If home treatments do not help your symptoms, procedures can be done to shrink or remove the hemorrhoids. What are the causes? This condition is caused by increased pressure in the anal area. This pressure may result from various things, including:  Constipation.  Straining to have a bowel movement.  Diarrhea.  Pregnancy.  Obesity.  Sitting for long periods of time.  Heavy lifting or other activity that causes you to strain.  Anal sex.  What are the signs or symptoms? Symptoms of this condition include:  Pain.  Anal itching or irritation.  Rectal bleeding.  Leakage of stool (feces).  Anal swelling.  One or more lumps around the anus.  How is this diagnosed? This condition can often be diagnosed through a visual exam. Other exams or tests may also be done, such as:  Examination of the rectal area with a gloved hand (digital rectal exam).  Examination of the anal canal using a small tube (anoscope).  A blood test, if you have lost a significant amount of blood.  A test to look inside the colon (sigmoidoscopy or colonoscopy).  How is this treated? This condition can usually be treated at  home. However, various procedures may be done if dietary changes, lifestyle changes, and other home treatments do not help your symptoms. These procedures can help make the hemorrhoids smaller or remove them completely. Some of these procedures involve surgery, and others do not. Common procedures include:  Rubber band ligation. Rubber bands are placed at the base of the hemorrhoids to cut off the blood supply to them.  Sclerotherapy. Medicine is injected into the hemorrhoids to shrink them.  Infrared coagulation. A type of light energy is used to get rid of the hemorrhoids.  Hemorrhoidectomy surgery. The hemorrhoids are surgically removed, and the veins that supply them are tied off.  Stapled hemorrhoidopexy surgery. A circular stapling device is used to remove the hemorrhoids and use staples to cut off the blood supply to them.  Follow these instructions at home: Eating and drinking  Eat foods that have a lot of fiber in them, such as whole grains, beans, nuts, fruits, and vegetables. Ask your health care provider about taking products that have added fiber (fiber supplements).  Drink enough fluid to keep your urine clear or pale yellow. Managing pain and swelling  Take warm sitz baths for 20 minutes, 3-4 times a day to ease pain and discomfort.  If directed, apply ice to the affected area. Using ice packs between sitz baths may be helpful. ? Put ice in a plastic bag. ? Place a towel between your skin and the bag. ? Leave the ice on for 20 minutes, 2-3 times a day. General instructions  Take over-the-counter and prescription medicines only as told by your health care provider.  Use medicated creams or suppositories as told.  Exercise regularly.  Go to the bathroom when you have the urge to have a bowel movement. Do not wait.  Avoid straining to have bowel movements.  Keep the anal area dry and clean. Use wet toilet paper or moist towelettes after a bowel movement.  Do not  sit on the toilet for long periods of time. This increases blood pooling and pain. Contact a health care provider if:  You have increasing pain and swelling that are not controlled by treatment or medicine.  You have uncontrolled bleeding.  You have difficulty having a bowel movement, or you are unable to have a bowel movement.  You have pain or inflammation  outside the area of the hemorrhoids. This information is not intended to replace advice given to you by your health care provider. Make sure you discuss any questions you have with your health care provider. Document Released: 05/26/2000 Document Revised: 10/27/2015 Document Reviewed: 02/10/2015 Elsevier Interactive Patient Education  2018 [employer].      Vertigo Vertigo is the feeling that you or your surroundings are moving when they are not. Vertigo can be dangerous if it occurs while you are doing something that could endanger you or others, such as driving. What are the causes? This condition is caused by a disturbance in the signals that are sent by your body's sensory systems to your brain. Different causes of a disturbance can lead to vertigo, including:  Infections, especially in the inner ear.  A bad reaction to a drug, or misuse of alcohol and medicines.  Withdrawal from drugs or alcohol.  Quickly changing positions, as when lying down or rolling over in bed.  Migraine headaches.  Decreased blood flow to the brain.  Decreased blood pressure.  Increased pressure in the brain from a head or neck injury, stroke, infection, tumor, or bleeding.  Central nervous system disorders.  What are the signs or symptoms? Symptoms of this condition usually occur when you move your head or your eyes in different directions. Symptoms may start suddenly, and they usually last for less than a minute. Symptoms may include:  Loss of balance and falling.  Feeling like you are spinning or moving.  Feeling like your  surroundings are spinning or moving.  Nausea and vomiting.  Blurred vision or double vision.  Difficulty hearing.  Slurred speech.  Dizziness.  Involuntary eye movement (nystagmus).  Symptoms can be mild and cause only slight annoyance, or they can be severe and interfere with daily life. Episodes of vertigo may return (recur) over time, and they are often triggered by certain movements. Symptoms may improve over time. How is this diagnosed? This condition may be diagnosed based on medical history and the quality of your nystagmus. Your health care provider may test your eye movements by asking you to quickly change positions to trigger the nystagmus. This may be called the Dix-Hallpike test, head thrust test, or roll test. You may be referred to a health care provider who specializes in ear, nose, and throat (ENT) problems (otolaryngologist) or a provider who specializes in disorders of the central nervous system (neurologist). You may have additional testing, including:  A physical exam.  Blood tests.  MRI.  A CT scan.  An electrocardiogram (ECG). This records electrical activity in your heart.  An electroencephalogram (EEG). This records electrical activity in your brain.  Hearing tests.  How is this treated? Treatment for this condition depends on the cause and the severity of the symptoms. Treatment options include:  Medicines to treat nausea or vertigo. These are usually used for severe cases. Some medicines that are used to treat other conditions may also reduce or eliminate vertigo symptoms. These include: ? Medicines that control allergies (antihistamines). ? Medicines that control seizures (anticonvulsants). ? Medicines that relieve depression (antidepressants). ? Medicines that relieve anxiety (sedatives).  Head movements to adjust your inner ear back to normal. If your vertigo is caused by an ear problem, your health care provider may recommend certain movements  to correct the problem.  Surgery. This is rare.  Follow these instructions at home: Safety  Move slowly.Avoid sudden body or head movements.  Avoid driving.  Avoid operating heavy machinery.  Avoid doing any tasks that would cause danger to you or others if you would have a vertigo episode during the task.  If you have trouble walking or keeping your balance, try using a cane for stability. If you feel dizzy or unstable, sit down right away.  Return to your normal activities as told by your health care provider. Ask your health care provider what activities are safe for you. General instructions  Take over-the-counter and prescription medicines only as told by your health care provider.  Avoid certain positions or movements as told by your health care provider.  Drink enough fluid to keep your urine clear or pale yellow.  Keep all follow-up visits as told by your health care provider. This is important. Contact a health care provider if:  Your medicines do not relieve your vertigo or they make it worse.  You have a fever.  Your condition gets worse or you develop new symptoms.  Your family or friends notice any behavioral changes.  Your nausea or vomiting gets worse.  You have numbness or a "pins and needles" sensation in part of your body. Get help right away if:  You have difficulty moving or speaking.  You are always dizzy.  You faint.  You develop severe headaches.  You have weakness in your hands, arms, or legs.  You have changes in your hearing or vision.  You develop a stiff neck.  You develop sensitivity to light. This information is not intended to replace advice given to you by your health care provider. Make sure you discuss any questions you have with your health care provider. Document Released: 03/08/2005 Document Revised: 11/10/2015 Document Reviewed: 09/21/2014 Elsevier Interactive Patient Education  2018 [employer].    IF you received  an x-ray today, you will receive an invoice from [facility]. Please contact [facility] at [phone] with questions or concerns regarding your invoice.   IF you received labwork today, you will receive an invoice from [facility]. Please contact [facility] at [phone] with questions or concerns regarding your invoice.   Our billing staff will not be able to assist you with questions regarding bills from these companies.  You will be contacted with the lab results as soon as they are available. The fastest way to get your results is to activate your My Chart account. Instructions are located on the last page of this paperwork. If you have not heard from [place] regarding the results in 2 weeks, please contact this office.     \

## 2017-10-19 LAB — COMPREHENSIVE METABOLIC PANEL
A/G RATIO: 1.8 (ref 1.2–2.2)
ALT: 17 IU/L (ref 0–44)
AST: 30 IU/L (ref 0–40)
Albumin: 4.5 g/dL (ref 3.6–4.8)
Alkaline Phosphatase: 80 IU/L (ref 39–117)
BUN/Creatinine Ratio: 13 (ref 10–24)
BUN: 14 mg/dL (ref 8–27)
Bilirubin Total: 0.9 mg/dL (ref 0.0–1.2)
CALCIUM: 8.8 mg/dL (ref 8.6–10.2)
CO2: 24 mmol/L (ref 20–29)
Chloride: 102 mmol/L (ref 96–106)
Creatinine, Ser: 1.09 mg/dL (ref 0.76–1.27)
GFR, EST AFRICAN AMERICAN: 82 mL/min/{1.73_m2} (ref >59)
GFR, EST NON AFRICAN AMERICAN: 71 mL/min/{1.73_m2} (ref >59)
Globulin, Total: 2.5 g/dL (ref 1.5–4.5)
Glucose: 95 mg/dL (ref 65–99)
POTASSIUM: 4.1 mmol/L (ref 3.5–5.2)
Sodium: 141 mmol/L (ref 134–144)
TOTAL PROTEIN: 7 g/dL (ref 6.0–8.5)

## 2017-10-19 LAB — CBC
Hematocrit: 41.4 % (ref 37.5–51.0)
Hemoglobin: 14.3 g/dL (ref 13.0–17.7)
MCH: 29.7 pg (ref 26.6–33.0)
MCHC: 34.5 g/dL (ref 31.5–35.7)
MCV: 86 fL (ref 79–97)
PLATELETS: 178 10*3/uL (ref 150–379)
RBC: 4.82 x10E6/uL (ref 4.14–5.80)
RDW: 14.2 % (ref 12.3–15.4)
WBC: 4 10*3/uL (ref 3.4–10.8)

## 2017-10-19 LAB — LIPID PANEL
CHOL/HDL RATIO: 2.6 ratio (ref 0.0–5.0)
Cholesterol, Total: 178 mg/dL (ref 100–199)
HDL: 69 mg/dL (ref >39)
LDL Calculated: 99 mg/dL (ref 0–99)
TRIGLYCERIDES: 51 mg/dL (ref 0–149)
VLDL CHOLESTEROL CAL: 10 mg/dL (ref 5–40)

## 2017-10-19 LAB — TSH: TSH: 2.32 u[IU]/mL (ref 0.450–4.500)

## 2017-10-19 LAB — PSA: Prostate Specific Ag, Serum: 1.3 ng/mL (ref 0.0–4.0)

## 2017-10-25 ENCOUNTER — Encounter: Payer: Self-pay | Admitting: Internal Medicine

## 2017-10-30 ENCOUNTER — Encounter: Payer: Self-pay | Admitting: *Deleted

## 2017-11-01 ENCOUNTER — Encounter: Payer: Self-pay | Admitting: Family Medicine

## 2017-11-01 ENCOUNTER — Ambulatory Visit: Payer: Commercial Managed Care - PPO | Admitting: Family Medicine

## 2017-11-01 VITALS — BP 110/68 | HR 66 | Temp 97.6°F | Ht 69.0 in | Wt 135.8 lb

## 2017-11-01 DIAGNOSIS — R5383 Other fatigue: Secondary | ICD-10-CM | POA: Diagnosis not present

## 2017-11-01 DIAGNOSIS — Z72820 Sleep deprivation: Secondary | ICD-10-CM | POA: Diagnosis not present

## 2017-11-01 DIAGNOSIS — R350 Frequency of micturition: Secondary | ICD-10-CM | POA: Diagnosis not present

## 2017-11-01 DIAGNOSIS — Z87898 Personal history of other specified conditions: Secondary | ICD-10-CM | POA: Diagnosis not present

## 2017-11-01 LAB — POCT URINALYSIS DIP (MANUAL ENTRY)
BILIRUBIN UA: NEGATIVE
BILIRUBIN UA: NEGATIVE mg/dL
Glucose, UA: NEGATIVE mg/dL
LEUKOCYTES UA: NEGATIVE
Nitrite, UA: NEGATIVE
PROTEIN UA: NEGATIVE mg/dL
Spec Grav, UA: 1.015 (ref 1.010–1.025)
Urobilinogen, UA: 0.2 E.U./dL
pH, UA: 7.5 (ref 5.0–8.0)

## 2017-11-01 LAB — POC MICROSCOPIC URINALYSIS (UMFC): Mucus: ABSENT

## 2017-11-01 NOTE — Patient Instructions (Addendum)
Fatigue is likely due to sleep deficit. You do need more sleep per day.  I would look at ways to adjust work schedule to allow more time per day to allow sleep. If you still feel fatigued after more sleep (7-8 hours ideally), please return to discuss further.   I will refer you to urology to discuss frequent urination and possible repeat prostate exam. Let me know if there are questions. Thank you for coming in today.   IF you received an x-ray today, you will receive an invoice from [facility]. Please contact [facility] at [phone] with questions or concerns regarding your invoice.   IF you received labwork today, you will receive an invoice from [facility]. Please contact [facility] at [phone] with questions or concerns regarding your invoice.   Our billing staff will not be able to assist you with questions regarding bills from these companies.  You will be contacted with the lab results as soon as they are available. The fastest way to get your results is to activate your My Chart account. Instructions are located on the last page of this paperwork. If you have not heard from [place] regarding the results in 2 weeks, please contact this office.

## 2017-11-01 NOTE — Progress Notes (Signed)
Subjective:  By signing my name below, I, Essence Howell, attest that this documentation has been prepared under the direction and in the presence of [name], MD Electronically Signed: Ladene Artist, ED Scribe 11/01/2017 at 10:35 AM.   Patient ID: Jacob Logan, male    DOB: Dec 29, 1950, 67 y.o.   MRN: [id number]  Chief Complaint  Patient presents with  . Urinary Tract Infection    follow up    HPI Jacob Logan is a 67 y.o. male who presents to Primary Care at [facility] for f/u on UTI. Seen 5/9 for CPE but was having urinary frequency at that time. Did report symptoms for yrs with urination every few hrs during the day but only waking 1-2 times at night. Did report h/o possible enlarged prostate. Last saw urology 6-7 yrs ago for kidney stones. UA at last OV, neg nitrite/LE. No blood. PSA normal at 1.3.  Pt also reports urinary urgency and incomplete emptying at this visit. Reports that he feels like he has to urinate but reports very small amounts and dribbling. States that symptoms are unchanged. Reports that he was a part of a study in [place] for a new medication which he states did not improve symptoms; has not followed up with them. Also reports that he has tried Flomax for kidney stones in the past.  Dizziness Pt states he closed his eyes yesterday while in the shower and "felt weird" but denies dizziness.  Sleep Disturbance Pt works 2 jobs, 1 being overnight. He has given a notice for his part-time job but is waiting for them to hire someone. States he only gets about 2-3 hours of sleep per night but naps on the bus on his way to appointments and even in the waiting room while waiting for this appointment. States he does sleep all day on his days off. Does report feeling well rested the day following his day off with a full night of rest. No h/o sleep apnea.  Patient Active Problem List   Diagnosis Date Noted  . Dyspnea on exertion, possible anginal equivalent 04/07/2015  .  CKD (chronic kidney disease), stage III ([place]) 11/22/2014  . Renal stone 11/19/2014  . Malnutrition of moderate degree ([place]) 11/19/2014  . Scoliosis (and kyphoscoliosis), idiopathic 10/18/2014   Past Medical History:  Diagnosis Date  . Anemia   . Kidney stone   . Malnourished ([place])   . 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 KIDNEY STONE SURGERY    . TONSILLECTOMY    . TONSILLECTOMY     No Known Allergies Prior to Admission medications   Medication Sig Start Date End Date Taking? Authorizing Provider  sildenafil (VIAGRA) 100 MG tablet Take 0.5-1 tablets (50-100 mg total) by mouth daily as needed for erectile dysfunction. 09/27/17  Yes [name], MD   Social History   Socioeconomic History  . Marital status: Single    Spouse name: Not on file  . Number of children: Not on file  . Years of education: Not on file  . Highest education level: Not on file  Occupational History  . Not on file  Social Needs  . Financial resource strain: Not on file  . Food insecurity:    Worry: Not on file    Inability: Not on file  . Transportation needs:    Medical: Not on file    Non-medical: Not on file  Tobacco Use  . Smoking status:  Never Smoker  . Smokeless tobacco: Never Used  Substance and Sexual Activity  . Alcohol use: Yes    Alcohol/week: 0.5 oz    Types: 1 Standard drinks or equivalent per week  . Drug use: No  . Sexual activity: Never  Lifestyle  . Physical activity:    Days per week: Not on file    Minutes per session: Not on file  . Stress: Not on file  Relationships  . Social connections:    Talks on phone: Not on file    Gets together: Not on file    Attends religious service: Not on file    Active member of club or organization: Not on file    Attends meetings of clubs or organizations: Not on file    Relationship status: Not on file  . Intimate partner violence:    Fear of current or ex  partner: Not on file    Emotionally abused: Not on file    Physically abused: Not on file    Forced sexual activity: Not on file  Other Topics Concern  . Not on file  Social History Narrative  . Not on file   Review of Systems  Genitourinary: Positive for frequency and urgency.  Neurological: Negative for dizziness (resolved).  Psychiatric/Behavioral: Positive for sleep disturbance.      Objective:   Physical Exam  Constitutional: He is oriented to person, place, and time. He appears well-developed and well-nourished. No distress.  HENT:  Head: Normocephalic and atraumatic.  Eyes: Conjunctivae and EOM are normal.  Neck: Neck supple. No tracheal deviation present.  Cardiovascular: Normal rate and regular rhythm. Exam reveals no gallop and no friction rub.  No murmur heard. Pulmonary/Chest: Effort normal and breath sounds normal. No respiratory distress.  Abdominal: Soft. There is no tenderness. There is no CVA tenderness.  Musculoskeletal: Normal range of motion.  Neurological: He is alert and oriented to person, place, and time.  Skin: Skin is warm and dry.  Psychiatric: He has a normal mood and affect. His behavior is normal.  Nursing note and vitals reviewed.  Vitals:   11/01/17 1005  BP: 110/68  Pulse: 66  Temp: 97.6 F (36.4 C)  TempSrc: Oral  SpO2: 99%  Weight: 135 lb 12.8 oz (61.6 kg)  Height: 5\' 9"  (1.753 m)      Assessment & Plan:  Jacob Logan is a 67 y.o. male Urinary frequency - Plan: POCT Microscopic Urinalysis ([facility]), POCT urinalysis dipstick, Ambulatory referral to Urology  -Possible BPH with lower urinary tract symptoms.  Medication options discussed, but would prefer to discuss those with urology.  Referral placed.  RTC precautions if any worsening prior to urology evaluation.  Other fatigue History of dizziness Sleep deficient  -Based on further discussion in office, suspect some of his fatigue, potentially dizziness may be related to sleep deficit.   He does plan on putting his medicine for the second job to allow more sleep than his current situation.  If still having fatigue after 7 to 8 hours of sleep return to discuss other potential causes, sooner if worse  No orders of the defined types were placed in this encounter.  Patient Instructions   Fatigue is likely due to sleep deficit. You do need more sleep per day.  I would look at ways to adjust work schedule to allow more time per day to allow sleep. If you still feel fatigued after more sleep (7-8 hours ideally), please return to discuss further.   I will  refer you to urology to discuss frequent urination and possible repeat prostate exam. Let me know if there are questions. Thank you for coming in today.   IF you received an x-ray today, you will receive an invoice from [facility]. Please contact [facility] at [phone] with questions or concerns regarding your invoice.   IF you received labwork today, you will receive an invoice from [facility]. Please contact [facility] at [phone] with questions or concerns regarding your invoice.   Our billing staff will not be able to assist you with questions regarding bills from these companies.  You will be contacted with the lab results as soon as they are available. The fastest way to get your results is to activate your My Chart account. Instructions are located on the last page of this paperwork. If you have not heard from [place] regarding the results in 2 weeks, please contact this office.       I personally performed the services described in this documentation, which was scribed in my presence. The recorded information has been reviewed and considered for accuracy and completeness, addended by me as needed, and agree with information above.  Signed,   [name], MD [facility] at [place].  11/02/17 1:00 PM

## 2017-12-20 ENCOUNTER — Encounter (INDEPENDENT_AMBULATORY_CARE_PROVIDER_SITE_OTHER): Payer: Self-pay

## 2017-12-20 ENCOUNTER — Encounter: Payer: Self-pay | Admitting: Internal Medicine

## 2017-12-20 ENCOUNTER — Ambulatory Visit: Payer: Commercial Managed Care - PPO | Admitting: Internal Medicine

## 2017-12-20 VITALS — BP 116/72 | HR 68 | Ht 68.5 in | Wt 143.0 lb

## 2017-12-20 DIAGNOSIS — Z1211 Encounter for screening for malignant neoplasm of colon: Secondary | ICD-10-CM | POA: Diagnosis not present

## 2017-12-20 DIAGNOSIS — R151 Fecal smearing: Secondary | ICD-10-CM | POA: Diagnosis not present

## 2017-12-20 DIAGNOSIS — K643 Fourth degree hemorrhoids: Secondary | ICD-10-CM | POA: Diagnosis not present

## 2017-12-20 DIAGNOSIS — K648 Other hemorrhoids: Secondary | ICD-10-CM | POA: Diagnosis not present

## 2017-12-20 MED ORDER — HYDROCORTISONE ACETATE 25 MG RE SUPP: 25.0000 mg | Freq: Every day | RECTAL | 0 refills | Status: DC

## 2017-12-20 NOTE — Progress Notes (Signed)
HISTORY OF PRESENT ILLNESS:  Jacob Logan is a 67 y.o. 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 Carlota Raspberry for evaluation of wetness around the anus, hemorrhoids, and screening colonoscopy. Patient tells me that he has had problems with wetness around the anus for 15 years. She tells me that he underwent routine colonoscopy greater than 10 years ago while living in [place]. He is under the impression that there were no abnormalities. This current problem is constant and he requires wearing a pad to protect his undergarments. He denies pain or bleeding. He states that he has to clean the area quite well. He reports several daily bowel movements which are formed or often soft. No family history of colon cancer. Review of outside blood work from 10/18/2017 for his normal comprehensive metabolic panel and normal CBC with hemoglobin 14.3. His only medication is Viagra  REVIEW OF SYSTEMS:  All non-GI ROS negative except for sinus allergy trouble, urinary frequency  Past Medical History:  Diagnosis Date  . Anemia   . Kidney stone   . Malnourished ([place])   . 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 KIDNEY STONE SURGERY    . TONSILLECTOMY    . TONSILLECTOMY      Social History Jacob Logan  reports that he has never smoked. He has never used smokeless tobacco. He reports that he drinks about 0.6 oz of alcohol per week. He reports that he does not use drugs.  family history includes Alzheimer's disease in his brother; Cancer in his mother; Cataracts in his maternal grandmother; Diabetes in his brother, father, and mother; Heart attack in his father; Heart disease in his brother, father, and mother; Hyperlipidemia in his brother, father, and mother; Hypertension in his brother, father, and mother; Obesity in his brother, father, and mother; Stroke in his brother;  Uterine cancer in his mother.  No Known Allergies     PHYSICAL EXAMINATION: Vital signs: BP 116/72   Pulse 68   Ht 5' 8.5" (1.74 m)   Wt 143 lb (64.9 kg)   BMI 21.43 kg/m   Constitutional: generally well-appearing, no acute distress Psychiatric: alert and oriented x3, cooperative Eyes: extraocular movements intact, anicteric, conjunctiva pink Mouth: oral pharynx moist, no lesions Neck: supple no lymphadenopathy Cardiovascular: heart regular rate and rhythm, no murmur Lungs: clear to auscultation bilaterally Abdomen: soft, nontender, nondistended, no obvious ascites, no peritoneal signs, normal bowel sounds, no organomegaly Rectal:large protruding inflamed internal hemorrhoid that does not spontaneously reduce. No other abnormalities Extremities: no lower extremity edema bilaterally Skin: no lesions on visible extremities Neuro: No focal deficits. Cranial nerves intact  ASSESSMENT:  #1. Problems with perianal wetness or seepage secondary to hemorrhoids #2. Large prolapsing internal hemorrhoid(s) #3. Remote colonoscopy   PLAN:  #1. Offered screening colonoscopy. The patient declined citing cost. The the risk of cancer understood #2. Return to PCP regarding other colon cancer screening strategies. However, stool tests are likely be positive with inflamed hemorrhoids #3. Prescribe Anusol HC suppositories #4. Recommend Metamucil 2 tablespoons daily #5. Sitz bath #6. General surgical referral to colorectal surgeon regarding prolapsing internal hemorrhoids #7. Return to primary care provider with GI follow-up as needed   A copy of this consultation note has been sent to Dr. Carlota Raspberry

## 2017-12-20 NOTE — Patient Instructions (Signed)
We have sent the following medications to your pharmacy for you to pick up at your convenience:  Anusol [facility] Suppositories  You will hear from [facility] to schedule a consult

## 2017-12-26 ENCOUNTER — Telehealth: Payer: Self-pay

## 2017-12-26 NOTE — Telephone Encounter (Signed)
Faxed referral docs to [facility]

## 2018-03-05 ENCOUNTER — Ambulatory Visit: Payer: Self-pay | Admitting: General Surgery

## 2018-03-05 NOTE — H&P (Signed)
History of Present Illness Jacob Ruff MD; 11/13/1581 9:02 AM) The patient is a 67 year old male who presents with hemorrhoids. 67 year old male who presents to the office complaining of several years of bleeding and rectal drainage. This is gotten worse over the past few months. He reports 1-2 bowel movements a day. He denies any constipation or straining. He notices some blood on toilet paper with every bowel movement. He has never had any hemorrhoid procedures in the past. His last colonoscopy was performed over 10 years ago. He saw Dr. Henrene Pastor and was given hemorrhoid suppository and fiber supplement to help with his bleeding internal hemorrhoids. He was offered a colonoscopy but declined.   Past Surgical History Jacob Lorenzo, LPN; 0/02/4075 8:08 AM) Tonsillectomy   Diagnostic Studies History Jacob Lorenzo, LPN; 01/10/1030 5:94 AM) Colonoscopy  >10 years ago  Medication History Jacob Lorenzo, LPN; 10/18/5927 2:44 AM) Sildenafil Citrate (100MG  Tablet, Oral) Active. Medications Reconciled  Social History Jacob Lorenzo, LPN; 11/11/8636 1:77 AM) Alcohol use  Occasional alcohol use. Caffeine use  Coffee. No drug use  Tobacco use  Never smoker.  Family History Jacob Lorenzo, LPN; 06/12/6577 0:38 AM) Arthritis  Father, Mother. Cerebrovascular Accident  Brother. Cervical Cancer  Mother. Depression  Brother. Diabetes Mellitus  Brother, Father, Mother. Heart Disease  Father. Heart disease in male family member before age 41  Hypertension  Brother, Father, Mother. Ischemic Bowel Disease  Mother. Ovarian Cancer  Mother. Thyroid problems  Mother.  Other Problems Jacob Lorenzo, LPN; 08/13/3830 9:19 AM) Hemorrhoids  Kidney Stone     Review of Systems Jacob Billings Dockery LPN; 06/17/6058 0:45 AM) General Not Present- Appetite Loss, Chills, Fatigue, Fever, Night Sweats, Weight Gain and Weight Loss. Skin Not Present- Change in Wart/Mole, Dryness, Hives, Jaundice, New  Lesions, Non-Healing Wounds, Rash and Ulcer. HEENT Present- Wears glasses/contact lenses. Not Present- Earache, Hearing Loss, Hoarseness, Nose Bleed, Oral Ulcers, Ringing in the Ears, Seasonal Allergies, Sinus Pain, Sore Throat, Visual Disturbances and Yellow Eyes. Respiratory Not Present- Bloody sputum, Chronic Cough, Difficulty Breathing, Snoring and Wheezing. Breast Not Present- Breast Mass, Breast Pain, Nipple Discharge and Skin Changes. Cardiovascular Present- Leg Cramps. Not Present- Chest Pain, Difficulty Breathing Lying Down, Palpitations, Rapid Heart Rate, Shortness of Breath and Swelling of Extremities. Gastrointestinal Present- Excessive gas and Hemorrhoids. Not Present- Abdominal Pain, Bloating, Bloody Stool, Change in Bowel Habits, Chronic diarrhea, Constipation, Difficulty Swallowing, Gets full quickly at meals, Indigestion, Nausea, Rectal Pain and Vomiting. Male Genitourinary Present- Frequency. Not Present- Blood in Urine, Change in Urinary Stream, Impotence, Nocturia, Painful Urination, Urgency and Urine Leakage. Musculoskeletal Not Present- Back Pain, Joint Pain, Joint Stiffness, Muscle Pain, Muscle Weakness and Swelling of Extremities. Neurological Not Present- Decreased Memory, Fainting, Headaches, Numbness, Seizures, Tingling, Tremor, Trouble walking and Weakness. Psychiatric Not Present- Anxiety, Bipolar, Change in Sleep Pattern, Depression, Fearful and Frequent crying. Endocrine Not Present- Cold Intolerance, Excessive Hunger, Hair Changes, Heat Intolerance, Hot flashes and New Diabetes. Hematology Not Present- Blood Thinners, Easy Bruising, Excessive bleeding, Gland problems, HIV and Persistent Infections.  Vitals Jacob Billings Dockery LPN; 02/19/7740 4:23 AM) 01/14/2018 8:57 AM Weight: 138.4 lb Height: 68in Body Surface Area: 1.75 m Body Mass Index: 21.04 kg/m  Temp.: 98.52F(Oral)  Pulse: 80 (Regular)  BP: 110/70 (Sitting, Left Arm, Standard)  Physical Exam Jacob Ruff MD; 02/14/3201 9:14 AM) General Mental Status-Alert. General Appearance-Not in acute distress. Build & Nutrition-Well nourished. Posture-Normal posture. Gait-Normal.  Head and Neck Head-normocephalic, atraumatic with no lesions or palpable masses. Trachea-midline.  Chest and Lung  Exam Chest and lung exam reveals -on auscultation, normal breath sounds, no adventitious sounds and normal vocal resonance.  Cardiovascular Cardiovascular examination reveals -normal heart sounds, regular rate and rhythm with no murmurs and no digital clubbing, cyanosis, edema, increased warmth or tenderness.  Abdomen Inspection Inspection of the abdomen reveals - No Hernias. Palpation/Percussion Palpation and Percussion of the abdomen reveal - Soft, Non Tender, No Rigidity (guarding), No hepatosplenomegaly and No Palpable abdominal masses.  Rectal Anorectal Exam External - normal external exam. Note: grade 3 mucosal prolapse. Internal - normal sphincter tone.  Neurologic Neurologic evaluation reveals -alert and oriented x 3 with no impairment of recent or remote memory, normal attention span and ability to concentrate, normal sensation and normal coordination.  Musculoskeletal Normal Exam - Bilateral-Upper Extremity Strength Normal and Lower Extremity Strength Normal.    Procedure Other: Procedure: Anoscopy....Marland KitchenMarland KitchenSurgeon: Marcello Moores....Marland KitchenMarland KitchenAfter the risks and benefits were explained, verbal consent was obtained for above procedure. A medical assistant chaperone was present thoroughout the entire procedure. ....Marland KitchenMarland KitchenAnesthesia: none....Marland KitchenMarland KitchenDiagnosis: rectal drainage and bleeding....Marland KitchenMarland KitchenFindings: Grade 2 left lateral internal hemorrhoid, noninflamed. grade 2, inflamed right anterior internal hemorrhoid. large grade 3 right posterior hemorrhoid with significant inflammation    Assessment & Plan Jacob Ruff MD; 08/16/4825 9:13 AM) PROLAPSED INTERNAL HEMORRHOIDS, GRADE 3  (M78.6) Impression: 67 year old male with prolapsing grade 3 internal hemorrhoids. These are too large to be rubber band ligated. We discussed his options which include reduction of hemorrhoids after bowel movements manually versus transient hemorrhoidal dearterialization versus hemorrhoidectomy. We went over all 3 options in detail and all questions were answered. He has decided to proceed with surgery.  Pt has refused a colonoscopy for his rectal bleeding.

## 2018-03-21 ENCOUNTER — Other Ambulatory Visit: Payer: Self-pay | Admitting: Family Medicine

## 2018-03-21 DIAGNOSIS — N529 Male erectile dysfunction, unspecified: Secondary | ICD-10-CM

## 2018-03-23 ENCOUNTER — Other Ambulatory Visit: Payer: Self-pay | Admitting: Family Medicine

## 2018-03-23 DIAGNOSIS — N529 Male erectile dysfunction, unspecified: Secondary | ICD-10-CM

## 2018-03-24 ENCOUNTER — Other Ambulatory Visit: Payer: Self-pay | Admitting: Family Medicine

## 2018-03-24 DIAGNOSIS — N529 Male erectile dysfunction, unspecified: Secondary | ICD-10-CM

## 2018-03-25 ENCOUNTER — Other Ambulatory Visit: Payer: Self-pay | Admitting: Family Medicine

## 2018-03-25 DIAGNOSIS — N529 Male erectile dysfunction, unspecified: Secondary | ICD-10-CM

## 2018-03-25 NOTE — Telephone Encounter (Signed)
Requested Prescriptions  Refused Prescriptions Disp Refills  . sildenafil (VIAGRA) 100 MG tablet [Pharmacy Med Name: SILDENAFIL 100MG  TABLETS] 5 tablet 0    Sig: TAKE 1/2 TO 1 TABLET BY MOUTH DAILY AS NEEDED FOR ERECTILE DYSFUNCTION     Urology: Erectile Dysfunction Agents Passed - 03/23/2018  3:44 AM      Passed - Last BP in normal range    BP Readings from Last 1 Encounters:  12/20/17 116/72         Passed - Valid encounter within last 12 months    Recent Outpatient Visits          4 months ago Urinary frequency   [facility], Ranell Patrick, MD   5 months ago Annual physical exam   [facility], Ranell Patrick, MD   5 months ago Skin lesion of scalp   [facility], Ranell Patrick, MD   10 months ago Cough   [facility] at [facility], PA-C   1 year ago Pain in joint involving right ankle and foot   [facility] at [facility], Ines Bloomer, MD

## 2018-04-29 ENCOUNTER — Encounter (HOSPITAL_BASED_OUTPATIENT_CLINIC_OR_DEPARTMENT_OTHER): Payer: Self-pay

## 2018-04-29 ENCOUNTER — Other Ambulatory Visit: Payer: Self-pay

## 2018-04-29 NOTE — Progress Notes (Signed)
Spoke with:  Jacob Logan NPO:  After Midnight, no gum, candy, or mints   Arrival time: 0530AM Labs: N/A AM medications: None Pre op orders: Yes Ride home: Was planning to take taxi, informed he had to have personal transportation home after anesthesia or surgery will be cancelled,  he will call back once arrangements have been made. Left message with Hassan Rowan to make Dr. Marcello Moores' office aware, that if patient does not have transportation home anesthesia will have to cancel his procedure.

## 2018-04-29 NOTE — Progress Notes (Signed)
Mr. Palma ride home day of surgery will be Landry Mellow (friend) [phone]

## 2018-05-01 NOTE — Anesthesia Preprocedure Evaluation (Addendum)
Anesthesia Evaluation  Patient identified by MRN, date of birth, ID band Patient awake    Reviewed: Allergy & Precautions, NPO status , Patient's Chart, lab work & pertinent test results  History of Anesthesia Complications Negative for: history of anesthetic complications  Airway Mallampati: II  TM Distance: >3 FB Neck ROM: Full    Dental  (+) Dental Advisory Given, Chipped,    Pulmonary neg pulmonary ROS,    breath sounds clear to auscultation       Cardiovascular negative cardio ROS   Rhythm:Regular Rate:Normal     Neuro/Psych negative neurological ROS  negative psych ROS   GI/Hepatic negative GI ROS, Neg liver ROS,   Endo/Other  negative endocrine ROS  Renal/GU  Hx kidney stones      Musculoskeletal  Scoliosis    Abdominal   Peds  Hematology negative hematology ROS (+)   Anesthesia Other Findings   Reproductive/Obstetrics                            Anesthesia Physical Anesthesia Plan  ASA: II  Anesthesia Plan: MAC   Post-op Pain Management:    Induction: Intravenous  PONV Risk Score and Plan: 1 and Propofol infusion, Treatment may vary due to age or medical condition and Ondansetron  Airway Management Planned: Nasal Cannula and Natural Airway  Additional Equipment: None  Intra-op Plan:   Post-operative Plan:   Informed Consent: I have reviewed the patients History and Physical, chart, labs and discussed the procedure including the risks, benefits and alternatives for the proposed anesthesia with the patient or authorized representative who has indicated his/her understanding and acceptance.     Plan Discussed with: CRNA and Anesthesiologist  Anesthesia Plan Comments:        Anesthesia Quick Evaluation

## 2018-05-01 NOTE — H&P (Signed)
The patient is a 67 year old male who presents with hemorrhoids. 67 year old male who presents to the office complaining of several years of bleeding and rectal drainage. This is gotten worse over the past few months. He reports 1-2 bowel movements a day. He denies any constipation or straining. He notices some blood on toilet paper with every bowel movement. He has never had any hemorrhoid procedures in the past. His last colonoscopy was performed over 10 years ago. He saw Dr. Henrene Pastor and was given hemorrhoid suppository and fiber supplement to help with his bleeding internal hemorrhoids. He was offered a colonoscopy but declined.   Past Surgical History Mammie Lorenzo, LPN; 08/17/1694 7:89 AM) Tonsillectomy   Diagnostic Studies History Mammie Lorenzo, LPN; 08/18/1015 5:10 AM) Colonoscopy  >10 years ago  Medication History Mammie Lorenzo, LPN; 07/17/8525 7:82 AM) Sildenafil Citrate (100MG  Tablet, Oral) Active. Medications Reconciled  Social History Mammie Lorenzo, LPN; 09/12/3534 1:44 AM) Alcohol use  Occasional alcohol use. Caffeine use  Coffee. No drug use  Tobacco use  Never smoker.  Family History Mammie Lorenzo, LPN; 08/10/5398 8:67 AM) Arthritis  Father, Mother. Cerebrovascular Accident  Brother. Cervical Cancer  Mother. Depression  Brother. Diabetes Mellitus  Brother, Father, Mother. Heart Disease  Father. Heart disease in male family member before age 73  Hypertension  Brother, Father, Mother. Ischemic Bowel Disease  Mother. Ovarian Cancer  Mother. Thyroid problems  Mother.  Other Problems Mammie Lorenzo, LPN; 11/11/9507 3:26 AM) Hemorrhoids  Kidney Stone     Review of Systems  General Not Present- Appetite Loss, Chills, Fatigue, Fever, Night Sweats, Weight Gain and Weight Loss. Skin Not Present- Change in Wart/Mole, Dryness, Hives, Jaundice, New Lesions, Non-Healing Wounds, Rash and Ulcer. HEENT Present- Wears glasses/contact lenses. Not  Present- Earache, Hearing Loss, Hoarseness, Nose Bleed, Oral Ulcers, Ringing in the Ears, Seasonal Allergies, Sinus Pain, Sore Throat, Visual Disturbances and Yellow Eyes. Respiratory Not Present- Bloody sputum, Chronic Cough, Difficulty Breathing, Snoring and Wheezing. Breast Not Present- Breast Mass, Breast Pain, Nipple Discharge and Skin Changes. Cardiovascular Present- Leg Cramps. Not Present- Chest Pain, Difficulty Breathing Lying Down, Palpitations, Rapid Heart Rate, Shortness of Breath and Swelling of Extremities. Gastrointestinal Present- Excessive gas and Hemorrhoids. Not Present- Abdominal Pain, Bloating, Bloody Stool, Change in Bowel Habits, Chronic diarrhea, Constipation, Difficulty Swallowing, Gets full quickly at meals, Indigestion, Nausea, Rectal Pain and Vomiting. Male Genitourinary Present- Frequency. Not Present- Blood in Urine, Change in Urinary Stream, Impotence, Nocturia, Painful Urination, Urgency and Urine Leakage. Musculoskeletal Not Present- Back Pain, Joint Pain, Joint Stiffness, Muscle Pain, Muscle Weakness and Swelling of Extremities. Neurological Not Present- Decreased Memory, Fainting, Headaches, Numbness, Seizures, Tingling, Tremor, Trouble walking and Weakness. Psychiatric Not Present- Anxiety, Bipolar, Change in Sleep Pattern, Depression, Fearful and Frequent crying. Endocrine Not Present- Cold Intolerance, Excessive Hunger, Hair Changes, Heat Intolerance, Hot flashes and New Diabetes. Hematology Not Present- Blood Thinners, Easy Bruising, Excessive bleeding, Gland problems, HIV and Persistent Infections.  BP 105/66   Pulse 63   Temp 98 F (36.7 C) (Oral)   Resp 16   Ht 5\' 9"  (1.753 m)   Wt 63.2 kg   SpO2 98%   BMI 20.57 kg/m    Physical Exam Mental Status-Alert. General Appearance-Not in acute distress. Build & Nutrition-Well nourished. Posture-Normal posture. Gait-Normal.  Head and Neck Head-normocephalic, atraumatic with no lesions or  palpable masses. Trachea-midline.  Chest and Lung Exam Chest and lung exam reveals -on auscultation, normal breath sounds, no adventitious sounds and normal vocal resonance.  Cardiovascular Cardiovascular  examination reveals -normal heart sounds, regular rate and rhythm with no murmurs and no digital clubbing, cyanosis, edema, increased warmth or tenderness.  Abdomen Inspection Inspection of the abdomen reveals - No Hernias. Palpation/Percussion Palpation and Percussion of the abdomen reveal - Soft, Non Tender, No Rigidity (guarding), No hepatosplenomegaly and No Palpable abdominal masses.  Rectal Anorectal Exam External - normal external exam. Note: grade 3 mucosal prolapse. Internal - normal sphincter tone.  Neurologic Neurologic evaluation reveals -alert and oriented x 3 with no impairment of recent or remote memory, normal attention span and ability to concentrate, normal sensation and normal coordination.  Musculoskeletal Normal Exam - Bilateral-Upper Extremity Strength Normal and Lower Extremity Strength Normal.    Procedure Other: Procedure: Anoscopy....Marland KitchenMarland KitchenSurgeon: Marcello Moores....Marland KitchenMarland KitchenAfter the risks and benefits were explained, verbal consent was obtained for above procedure. A medical assistant chaperone was present thoroughout the entire procedure. ....Marland KitchenMarland KitchenAnesthesia: none....Marland KitchenMarland KitchenDiagnosis: rectal drainage and bleeding....Marland KitchenMarland KitchenFindings: Grade 2 left lateral internal hemorrhoid, noninflamed. grade 2, inflamed right anterior internal hemorrhoid. large grade 3 right posterior hemorrhoid with significant inflammation    Assessment & Plan  PROLAPSED INTERNAL HEMORRHOIDS, GRADE 3 (Y92.4) Impression: 67 year old male with prolapsing grade 3 internal hemorrhoids. These are too large to be rubber band ligated. We discussed his options which include reduction of hemorrhoids after bowel movements manually versus transient hemorrhoidal dearterialization versus  hemorrhoidectomy. We went over all 3 options in detail and all questions were answered. He has decided to proceed with surgery.  Pt has refused a colonoscopy for his rectal bleeding.

## 2018-05-02 ENCOUNTER — Ambulatory Visit (HOSPITAL_BASED_OUTPATIENT_CLINIC_OR_DEPARTMENT_OTHER)
Admission: RE | Admit: 2018-05-02 | Discharge: 2018-05-02 | Disposition: A | Discharge status: Home / Self Care | Payer: Commercial Managed Care - PPO | Source: Ambulatory Visit | Attending: General Surgery | Admitting: General Surgery

## 2018-05-02 ENCOUNTER — Encounter (HOSPITAL_BASED_OUTPATIENT_CLINIC_OR_DEPARTMENT_OTHER): Payer: Self-pay | Admitting: *Deleted

## 2018-05-02 ENCOUNTER — Ambulatory Visit (HOSPITAL_BASED_OUTPATIENT_CLINIC_OR_DEPARTMENT_OTHER): Payer: Commercial Managed Care - PPO | Admitting: Anesthesiology

## 2018-05-02 ENCOUNTER — Encounter (HOSPITAL_BASED_OUTPATIENT_CLINIC_OR_DEPARTMENT_OTHER)
Admission: RE | Disposition: A | Discharge status: Home / Self Care | Payer: Self-pay | Source: Ambulatory Visit | Attending: General Surgery

## 2018-05-02 ENCOUNTER — Other Ambulatory Visit: Payer: Self-pay

## 2018-05-02 DIAGNOSIS — Z8261 Family history of arthritis: Secondary | ICD-10-CM | POA: Insufficient documentation

## 2018-05-02 DIAGNOSIS — Z79899 Other long term (current) drug therapy: Secondary | ICD-10-CM | POA: Insufficient documentation

## 2018-05-02 DIAGNOSIS — K642 Third degree hemorrhoids: Secondary | ICD-10-CM | POA: Insufficient documentation

## 2018-05-02 DIAGNOSIS — Z8249 Family history of ischemic heart disease and other diseases of the circulatory system: Secondary | ICD-10-CM | POA: Insufficient documentation

## 2018-05-02 DIAGNOSIS — Z823 Family history of stroke: Secondary | ICD-10-CM | POA: Diagnosis not present

## 2018-05-02 DIAGNOSIS — Z8041 Family history of malignant neoplasm of ovary: Secondary | ICD-10-CM | POA: Diagnosis not present

## 2018-05-02 DIAGNOSIS — Z87442 Personal history of urinary calculi: Secondary | ICD-10-CM | POA: Insufficient documentation

## 2018-05-02 DIAGNOSIS — Z8349 Family history of other endocrine, nutritional and metabolic diseases: Secondary | ICD-10-CM | POA: Diagnosis not present

## 2018-05-02 DIAGNOSIS — M419 Scoliosis, unspecified: Secondary | ICD-10-CM | POA: Insufficient documentation

## 2018-05-02 HISTORY — DX: Personal history of other diseases of male genital organs: Z87.438

## 2018-05-02 HISTORY — DX: Unspecified hemorrhoids: K64.9

## 2018-05-02 HISTORY — DX: Plantar fascial fibromatosis: M72.2

## 2018-05-02 HISTORY — DX: Personal history of urinary calculi: Z87.442

## 2018-05-02 HISTORY — PX: TRANSANAL HEMORRHOIDAL DEARTERIALIZATION: SHX6136

## 2018-05-02 HISTORY — DX: Presence of spectacles and contact lenses: Z97.3

## 2018-05-02 SURGERY — TRANSANAL HEMORRHOIDAL DEARTERIALIZATION
Anesthesia: Monitor Anesthesia Care

## 2018-05-02 MED ORDER — ACETAMINOPHEN 650 MG RE SUPP
650.0000 mg | RECTAL | Status: DC | PRN
  Filled 2018-05-02: qty 1

## 2018-05-02 MED ORDER — CELECOXIB 200 MG PO CAPS
200.0000 mg | ORAL_CAPSULE | ORAL | Status: AC
  Administered 2018-05-02: 200 mg via ORAL
  Filled 2018-05-02: qty 1

## 2018-05-02 MED ORDER — FENTANYL CITRATE (PF) 100 MCG/2ML IJ SOLN
INTRAMUSCULAR | Status: AC
  Filled 2018-05-02: qty 2

## 2018-05-02 MED ORDER — OXYCODONE HCL 5 MG PO TABS
5.0000 mg | ORAL_TABLET | Freq: Once | ORAL | Status: DC | PRN
  Filled 2018-05-02: qty 1

## 2018-05-02 MED ORDER — SODIUM CHLORIDE 0.9% FLUSH
3.0000 mL | INTRAVENOUS | Status: DC | PRN
  Filled 2018-05-02: qty 3

## 2018-05-02 MED ORDER — PROPOFOL 500 MG/50ML IV EMUL
INTRAVENOUS | Status: AC
  Filled 2018-05-02: qty 50

## 2018-05-02 MED ORDER — EPHEDRINE SULFATE 50 MG/ML IJ SOLN
INTRAMUSCULAR | Status: DC | PRN
  Administered 2018-05-02 (×2): 10 mg via INTRAVENOUS

## 2018-05-02 MED ORDER — GLYCOPYRROLATE 0.2 MG/ML IJ SOLN
INTRAMUSCULAR | Status: DC | PRN
  Administered 2018-05-02: 0.1 mg via INTRAVENOUS

## 2018-05-02 MED ORDER — ONDANSETRON HCL 4 MG/2ML IJ SOLN
INTRAMUSCULAR | Status: DC | PRN
  Administered 2018-05-02: 4 mg via INTRAVENOUS

## 2018-05-02 MED ORDER — ONDANSETRON HCL 4 MG/2ML IJ SOLN
4.0000 mg | Freq: Once | INTRAMUSCULAR | Status: DC | PRN
  Filled 2018-05-02: qty 2

## 2018-05-02 MED ORDER — FENTANYL CITRATE (PF) 100 MCG/2ML IJ SOLN
INTRAMUSCULAR | Status: DC | PRN
  Administered 2018-05-02 (×2): 50 ug via INTRAVENOUS

## 2018-05-02 MED ORDER — EPHEDRINE 5 MG/ML INJ
INTRAVENOUS | Status: AC
  Filled 2018-05-02: qty 10

## 2018-05-02 MED ORDER — FENTANYL CITRATE (PF) 100 MCG/2ML IJ SOLN
25.0000 ug | INTRAMUSCULAR | Status: DC | PRN
  Filled 2018-05-02: qty 1

## 2018-05-02 MED ORDER — OXYCODONE HCL 5 MG PO TABS: 5.0000 mg | ORAL_TABLET | Freq: Four times a day (QID) | ORAL | 0 refills | Status: DC | PRN

## 2018-05-02 MED ORDER — PROPOFOL 500 MG/50ML IV EMUL
INTRAVENOUS | Status: DC | PRN
  Administered 2018-05-02: 80 ug/kg/min via INTRAVENOUS

## 2018-05-02 MED ORDER — MIDAZOLAM HCL 2 MG/2ML IJ SOLN
INTRAMUSCULAR | Status: AC
  Filled 2018-05-02: qty 2

## 2018-05-02 MED ORDER — OXYCODONE HCL 5 MG PO TABS
5.0000 mg | ORAL_TABLET | ORAL | Status: DC | PRN
  Filled 2018-05-02: qty 2

## 2018-05-02 MED ORDER — BUPIVACAINE LIPOSOME 1.3 % IJ SUSP
INTRAMUSCULAR | Status: DC | PRN
  Administered 2018-05-02: 20 mL

## 2018-05-02 MED ORDER — LIDOCAINE HCL (CARDIAC) PF 100 MG/5ML IV SOSY
PREFILLED_SYRINGE | INTRAVENOUS | Status: DC | PRN
  Administered 2018-05-02: 100 mg via INTRAVENOUS

## 2018-05-02 MED ORDER — OXYCODONE HCL 5 MG/5ML PO SOLN
5.0000 mg | Freq: Once | ORAL | Status: DC | PRN
  Filled 2018-05-02: qty 5

## 2018-05-02 MED ORDER — ACETAMINOPHEN 500 MG PO TABS
ORAL_TABLET | ORAL | Status: AC
  Filled 2018-05-02: qty 2

## 2018-05-02 MED ORDER — ONDANSETRON HCL 4 MG/2ML IJ SOLN
INTRAMUSCULAR | Status: AC
  Filled 2018-05-02: qty 2

## 2018-05-02 MED ORDER — ACETAMINOPHEN 500 MG PO TABS
1000.0000 mg | ORAL_TABLET | ORAL | Status: AC
  Administered 2018-05-02: 1000 mg via ORAL
  Filled 2018-05-02: qty 2

## 2018-05-02 MED ORDER — GLYCOPYRROLATE PF 0.2 MG/ML IJ SOSY
PREFILLED_SYRINGE | INTRAMUSCULAR | Status: AC
  Filled 2018-05-02: qty 1

## 2018-05-02 MED ORDER — MIDAZOLAM HCL 5 MG/5ML IJ SOLN
INTRAMUSCULAR | Status: DC | PRN
  Administered 2018-05-02: 2 mg via INTRAVENOUS

## 2018-05-02 MED ORDER — SODIUM CHLORIDE 0.9 % IV SOLN
250.0000 mL | INTRAVENOUS | Status: DC | PRN
  Filled 2018-05-02: qty 250

## 2018-05-02 MED ORDER — ACETAMINOPHEN 325 MG PO TABS
650.0000 mg | ORAL_TABLET | ORAL | Status: DC | PRN
  Filled 2018-05-02: qty 2

## 2018-05-02 MED ORDER — CELECOXIB 200 MG PO CAPS
ORAL_CAPSULE | ORAL | Status: AC
  Filled 2018-05-02: qty 1

## 2018-05-02 MED ORDER — BUPIVACAINE-EPINEPHRINE 0.5% -1:200000 IJ SOLN
INTRAMUSCULAR | Status: DC | PRN
  Administered 2018-05-02: 30 mL

## 2018-05-02 MED ORDER — SODIUM CHLORIDE 0.9% FLUSH
3.0000 mL | Freq: Two times a day (BID) | INTRAVENOUS | Status: DC
  Filled 2018-05-02: qty 3

## 2018-05-02 MED ORDER — DIAZEPAM 5 MG PO TABS: 5.0000 mg | ORAL_TABLET | Freq: Four times a day (QID) | ORAL | 0 refills | Status: DC | PRN

## 2018-05-02 MED ORDER — GABAPENTIN 300 MG PO CAPS
300.0000 mg | ORAL_CAPSULE | ORAL | Status: AC
  Administered 2018-05-02: 300 mg via ORAL
  Filled 2018-05-02: qty 1

## 2018-05-02 MED ORDER — LIDOCAINE 5 % EX OINT
TOPICAL_OINTMENT | CUTANEOUS | Status: DC | PRN
  Administered 2018-05-02: 1 via TOPICAL

## 2018-05-02 MED ORDER — BUPIVACAINE LIPOSOME 1.3 % IJ SUSP
20.0000 mL | Freq: Once | INTRAMUSCULAR | Status: DC
  Filled 2018-05-02: qty 20

## 2018-05-02 MED ORDER — LACTATED RINGERS IV SOLN
INTRAVENOUS | Status: DC
  Administered 2018-05-02 (×2): via INTRAVENOUS
  Filled 2018-05-02: qty 1000

## 2018-05-02 MED ORDER — GABAPENTIN 300 MG PO CAPS
ORAL_CAPSULE | ORAL | Status: AC
  Filled 2018-05-02: qty 1

## 2018-05-02 MED ORDER — LIDOCAINE 2% (20 MG/ML) 5 ML SYRINGE
INTRAMUSCULAR | Status: AC
  Filled 2018-05-02: qty 5

## 2018-05-02 SURGICAL SUPPLY — 34 items
BLADE HEX COATED 2.75 (ELECTRODE) IMPLANT
BRIEF STRETCH FOR OB PAD LRG (UNDERPADS AND DIAPERS) IMPLANT
COVER BACK TABLE 60X90IN (DRAPES) ×2 IMPLANT
COVER WAND RF STERILE (DRAPES) ×2 IMPLANT
DECANTER SPIKE VIAL GLASS SM (MISCELLANEOUS) IMPLANT
DRAPE SHEET LG 3/4 BI-LAMINATE (DRAPES) ×2 IMPLANT
DRAPE UNDERBUTTOCKS STRL (DRAPE) ×2 IMPLANT
DRSG PAD ABDOMINAL 8X10 ST (GAUZE/BANDAGES/DRESSINGS) ×2 IMPLANT
ELECT REM PT RETURN 9FT ADLT (ELECTROSURGICAL) ×2
ELECTRODE REM PT RTRN 9FT ADLT (ELECTROSURGICAL) ×1 IMPLANT
GAUZE SPONGE 4X4 12PLY STRL (GAUZE/BANDAGES/DRESSINGS) ×2 IMPLANT
GLOVE BIO SURGEON STRL SZ 6.5 (GLOVE) ×2 IMPLANT
GLOVE BIOGEL PI IND STRL 7.0 (GLOVE) ×1 IMPLANT
GLOVE BIOGEL PI INDICATOR 7.0 (GLOVE) ×1
GOWN STRL REUS W/TWL 2XL LVL3 (GOWN DISPOSABLE) ×2 IMPLANT
GOWN STRL REUS W/TWL XL LVL3 (GOWN DISPOSABLE) ×2 IMPLANT
HEMOSTAT SURGICEL 4X8 (HEMOSTASIS) IMPLANT
KIT SLIDE ONE PROLAPS HEMORR (KITS) ×2 IMPLANT
KIT TURNOVER CYSTO (KITS) ×2 IMPLANT
LEGGING LITHOTOMY PAIR STRL (DRAPES) ×2 IMPLANT
LUBRICANT JELLY K Y 4OZ (MISCELLANEOUS) ×2 IMPLANT
NEEDLE HYPO 22GX1.5 SAFETY (NEEDLE) ×2 IMPLANT
PACK BASIN DAY SURGERY FS (CUSTOM PROCEDURE TRAY) ×2 IMPLANT
PAD ARMBOARD 7.5X6 YLW CONV (MISCELLANEOUS) IMPLANT
PENCIL BUTTON HOLSTER BLD 10FT (ELECTRODE) IMPLANT
SPONGE HEMORRHOID 8X3CM (HEMOSTASIS) IMPLANT
SUT CHROMIC 2 0 SH (SUTURE) IMPLANT
SUT CHROMIC 3 0 SH 27 (SUTURE) IMPLANT
SUT VIC AB 2-0 UR6 27 (SUTURE) IMPLANT
SYR CONTROL 10ML LL (SYRINGE) ×2 IMPLANT
TOWEL OR 17X24 6PK STRL BLUE (TOWEL DISPOSABLE) ×2 IMPLANT
TRAY DSU PREP LF (CUSTOM PROCEDURE TRAY) ×2 IMPLANT
TUBE CONNECTING 12X1/4 (SUCTIONS) ×2 IMPLANT
YANKAUER SUCT BULB TIP NO VENT (SUCTIONS) ×2 IMPLANT

## 2018-05-02 NOTE — Discharge Instructions (Addendum)
ANORECTAL SURGERY: POST OP INSTRUCTIONS 1. Take your usually prescribed home medications unless otherwise directed. 2. DIET: During the first few hours after surgery sip on some liquids until you are able to urinate.  It is normal to not urinate for several hours after this surgery.  If you feel uncomfortable, please contact the office for instructions.  After you are able to urinate,you may eat, if you feel like it.  Follow a light bland diet the first 24 hours after arrival home, such as soup, liquids, crackers, etc.  Be sure to include lots of fluids daily (6-8 glasses).  Avoid fast food or heavy meals, as your are more likely to get nauseated.  Eat a low fat diet the next few days after surgery.  Limit caffeine intake to 1-2 servings a day. 3. PAIN CONTROL: a. Pain is best controlled by a usual combination of several different methods TOGETHER: i. Muscle relaxation 1.  Soak in a warm bath (or Sitz bath) three times a day and after bowel movements.  Continue to do this until all pain is resolved. 2. Take the muscle relaxer (Valium) every 6 hours for the first 2 days after surgery  ii. Over the counter pain medication iii. Prescription pain medication b. Most patients will experience some swelling and discomfort in the anus/rectal area and incisions.  Heat such as warm towels, sitz baths, warm baths, etc to help relax tight/sore spots and speed recovery.  Some people prefer to use ice, especially in the first couple days after surgery, as it may decrease the pain and swelling, or alternate between ice & heat.  Experiment to what works for you.  Swelling and bruising can take several weeks to resolve.  Pain can take even longer to completely resolve. c. It is helpful to take an over-the-counter pain medication regularly for the first few weeks.  Choose one of the following that works best for you: i. Naproxen (Aleve, etc)  Two 279m tabs twice a day ii. Ibuprofen (Advil, etc) Three 203mtabs four  times a day (every meal & bedtime) d. A  prescription for pain medication (such as percocet, oxycodone, hydrocodone, etc) should be given to you upon discharge.  Take your pain medication as prescribed.  i. If you are having problems/concerns with the prescription medicine (does not control pain, nausea, vomiting, rash, itching, etc), please call [phone] see if we need to switch you to a different pain medicine that will work better for you and/or control your side effect better. ii. If you need a refill on your pain medication, please contact your pharmacy.  They will contact our office to request authorization. Prescriptions will not be filled after 5 pm or on week-ends. 4. KEEP YOUR BOWELS REGULAR and AVOID CONSTIPATION a. The goal is one to two soft bowel movements a day.  You should at least have a bowel movement every other day. b. Avoid getting constipated.  Between the surgery and the pain medications, it is common to experience some constipation. This can be very painful after rectal surgery.  Increasing fluid intake and taking a fiber supplement (such as Metamucil, Citrucel, FiberCon, etc) 1-2 times a day regularly will usually help prevent this problem from occurring.  A stool softener like colace is also recommended.  This can be purchased over the counter at your pharmacy.  You can take it up to 3 times a day.  If you do not have a bowel movement after 24 hrs since your surgery,  take one does of milk of magnesia.  If you still haven't had a bowel movement 8-12 hours after that dose, take another dose.  If you don't have a bowel movement 48 hrs after surgery, purchase a Fleets enema from the drug store and administer gently per package instructions.  If you still are having trouble with your bowel movements after that, please call the office for further instructions. °c. If you develop diarrhea or have many loose bowel movements, simplify your diet to bland foods & liquids for a few  days.  Stop any stool softeners and decrease your fiber supplement.  Switching to mild anti-diarrheal medications (Kayopectate, Pepto Bismol) can help.  If this worsens or does not improve, please call us. ° °5. Wound Care °a. Remove your bandages before your first bowel movement or 8 hours after surgery.     °b. Remove any wound packing material at this tim,e as well.  You do not need to repack the wound unless instructed otherwise.  Wear an absorbent pad or soft cotton gauze in your underwear to catch any drainage and help keep the area clean. You should change this every 2-3 hours while awake. °c. Keep the area clean and dry.  Bathe / shower every day, especially after bowel movements.  Keep the area clean by showering / bathing over the incision / wound.   It is okay to soak an open wound to help wash it.  Wet wipes or showers / gentle washing after bowel movements is often less traumatic than regular toilet paper. °d. You may have some styrofoam-like soft packing in the rectum which will come out with the first bowel movement.  °e. You will often notice bleeding with bowel movements.  This should slow down by the end of the first week of surgery °f. Expect some drainage.  This should slow down, too, by the end of the first week of surgery.  Wear an absorbent pad or soft cotton gauze in your underwear until the drainage stops. °g. Do Not sit on a rubber or pillow ring.  This can make you symptoms worse.  You may sit on a soft pillow if needed.  °6. ACTIVITIES as tolerated:   °a. You may resume regular (light) daily activities beginning the next day--such as daily self-care, walking, climbing stairs--gradually increasing activities as tolerated.  If you can walk 30 minutes without difficulty, it is safe to try more intense activity such as jogging, treadmill, bicycling, low-impact aerobics, swimming, etc. °b. Save the most intensive and strenuous activity for last such as sit-ups, heavy lifting, contact sports,  etc  Refrain from any heavy lifting or straining until you are off narcotics for pain control.   °c. You may drive when you are no longer taking prescription pain medication, you can comfortably sit for long periods of time, and you can safely maneuver your car and apply brakes. °d. You may have sexual intercourse when it is comfortable.  °7. FOLLOW UP in our office °a. Please call [facility] at [phone] to set up an appointment to see your surgeon in the office for a follow-up appointment approximately 3-4 weeks after your surgery. °b. Make sure that you call for this appointment the day you arrive home to insure a convenient appointment time. °10. IF YOU HAVE DISABILITY OR FAMILY LEAVE FORMS, BRING THEM TO THE OFFICE FOR PROCESSING.  DO NOT GIVE THEM TO YOUR DOCTOR. ° ° ° ° °WHEN TO CALL US [phone]: °1. Poor pain control °  2. Reactions / problems with new medications (rash/itching, nausea, etc)  3. Fever over 101.5 F (38.5 C) 4. Inability to urinate 5. Nausea and/or vomiting 6. Worsening swelling or bruising 7. Continued bleeding from incision. 8. Increased pain, redness, or drainage from the incision  The clinic staff is available to answer your questions during regular business hours (8:30am-5pm).  Please dont hesitate to call and ask to speak to one of our nurses for clinical concerns.   A surgeon from [facility] is always on call at the hospitals   If you have a medical emergency, go to the nearest emergency room or call 911.    [facility], [address], [place], [place], [place]  [address] ? MAIN: [phone] ? TOLL FREE: [phone] ? FAX [phone] [email or web address]   Post Anesthesia Home Care Instructions  Activity: Get plenty of rest for the remainder of the day. A responsible adult should stay with you for 24 hours following the procedure.  For the next 24 hours, DO NOT: -Drive a car -Paediatric nurse -Drink  alcoholic beverages -Take any medication unless instructed by your physician -Make any legal decisions or sign important papers.  Meals: Start with liquid foods such as gelatin or soup. Progress to regular foods as tolerated. Avoid greasy, spicy, heavy foods. If nausea and/or vomiting occur, drink only clear liquids until the nausea and/or vomiting subsides. Call your physician if vomiting continues.  Special Instructions/Symptoms: Your throat may feel dry or sore from the anesthesia or the breathing tube placed in your throat during surgery. If this causes discomfort, gargle with warm salt water. The discomfort should disappear within 24 hours.  If you had a scopolamine patch placed behind your ear for the management of post- operative nausea and/or vomiting:  1. The medication in the patch is effective for 72 hours, after which it should be removed.  Wrap patch in a tissue and discard in the trash. Wash hands thoroughly with soap and water. 2. You may remove the patch earlier than 72 hours if you experience unpleasant side effects which may include dry mouth, dizziness or visual disturbances. 3. Avoid touching the patch. Wash your hands with soap and water after contact with the patch.   Information for Discharge Teaching: EXPAREL (bupivacaine liposome injectable suspension)   Your surgeon gave you EXPAREL(bupivacaine) in your surgical incision to help control your pain after surgery.   EXPAREL is a local anesthetic that provides pain relief by numbing the tissue around the surgical site.  EXPAREL is designed to release pain medication over time and can control pain for up to 72 hours.  Depending on how you respond to EXPAREL, you may require less pain medication during your recovery.  Possible side effects:  Temporary loss of sensation or ability to move in the area where bupivacaine was injected.  Nausea, vomiting, constipation  Rarely, numbness and tingling in your mouth or  lips, lightheadedness, or anxiety may occur.  Call your doctor right away if you think you may be experiencing any of these sensations, or if you have other questions regarding possible side effects.  Follow all other discharge instructions given to you by your surgeon or nurse. Eat a healthy diet and drink plenty of water or other fluids.  If you return to the hospital for any reason within 96 hours following the administration of EXPAREL, please inform your health care providers.

## 2018-05-02 NOTE — Anesthesia Postprocedure Evaluation (Signed)
Anesthesia Post Note  Patient: Jacob Logan  Procedure(s) Performed: TRANSANAL HEMORRHOIDAL DEARTERIALIZATION (N/A )     Patient location during evaluation: PACU Anesthesia Type: MAC Level of consciousness: awake and alert Pain management: pain level controlled Vital Signs Assessment: post-procedure vital signs reviewed and stable Respiratory status: spontaneous breathing, nonlabored ventilation and respiratory function stable Cardiovascular status: stable and blood pressure returned to baseline Anesthetic complications: no    Last Vitals:  Vitals:   05/02/18 0900 05/02/18 1036  BP: 128/70 (!) 143/79  Pulse: (!) 59 (!) 53  Resp: 14 14  Temp:  36.4 C  SpO2: 99% 100%    Last Pain:  Vitals:   05/02/18 1030  TempSrc:   PainSc: 0-No pain                 Audry Pili

## 2018-05-02 NOTE — Transfer of Care (Signed)
Immediate Anesthesia Transfer of Care Note  Patient: Jacob Logan  Procedure(s) Performed: TRANSANAL HEMORRHOIDAL DEARTERIALIZATION (N/A )  Patient Location: PACU  Anesthesia Type:MAC  Level of Consciousness: awake, alert  and oriented  Airway & Oxygen Therapy: Patient Spontanous Breathing and Patient connected to face mask oxygen  Post-op Assessment: Report given to RN and Post -op Vital signs reviewed and stable  Post vital signs: Reviewed and stable  Last Vitals:  Vitals Value Taken Time  BP    Temp    Pulse    Resp    SpO2      Last Pain:  Vitals:   05/02/18 0552  TempSrc:   PainSc: 0-No pain      Patients Stated Pain Goal: 8 (04/13/10 1735)  Complications: No apparent anesthesia complications

## 2018-05-02 NOTE — Op Note (Signed)
05/02/2018  8:16 AM  PATIENT:  Jacob Logan  67 y.o. male  Patient Care Team: [name], MD as PCP - General (Family Medicine)  PRE-OPERATIVE DIAGNOSIS:  GRADE 3 BLEEDING HEMORRHOIDS  POST-OPERATIVE DIAGNOSIS:  GRADE 3 BLEEDING HEMORRHOIDS  PROCEDURE:   TRANSANAL HEMORRHOIDAL DEARTERIALIZATION   Surgeon(s): Leighton Ruff, MD  ASSISTANT: none   ANESTHESIA:   local and MAC  EBL: 58m  Total I/O In: -  Out: 10 [Blood:10]  DRAINS: none   SPECIMEN:  No Specimen  DISPOSITION OF SPECIMEN:  N/A  COUNTS:  YES  PLAN OF CARE: Discharge to home after PACU  PATIENT DISPOSITION:  PACU - hemodynamically stable.  INDICATION: 67y.o. M with grade 3 bleeding internal hemorrhoids   OR FINDINGS: R ant and post grade 3 internal hemorrhoids.  Grade 2 L lateral  Description: Informed consent was confirmed. Patient underwent general anesthesia without difficulty. Patient was placed into lithotomy positioning.  The perianal region was prepped and draped in sterile fashion. Surgical time out confirmed or plan.  I did digital rectal examination and then transitioned over to anoscopy to get a sense of the anatomy.  I switched over to the TLackaw[facility] Surgery Centerfiberoptically lit Doppler anocope.   Using the Doppler on the tip of the [place], I identified the arterial hemorrhoidal vessels coming in in the classic hexagonal anatomical pattern  (right posterior/lateral/anterior, left posterior /lateral/anterior).    I proceeded to ligate the hemorrhoidal arteries. I used a 2-0 Vicryl suture on a UR-6 needle in a figure-of-eight fashion over the signal around 6 cm proximal to the anal verge. I then ran that stitch longitudinally more distally to the dentate line. I then tied that stitch down to cause a hemorrhoidopexy. I did that for all 6 locations.    I redid Doppler anoscopy. I Identified a signal at the R anterior location.  I isolated and ligated this with a figure-of-eight stitch. Signals went  away.  At completion of this, all hemorrhoids were reduced into the rectum.  There is no more prolapse. External anatomy looked normal.  I repeated anoscopy and examination.   Hemostasis was good.  Patient is being extubated go to recovery room.  I am about to discuss the patient's status to the family.

## 2018-05-03 ENCOUNTER — Encounter (HOSPITAL_BASED_OUTPATIENT_CLINIC_OR_DEPARTMENT_OTHER): Payer: Self-pay | Admitting: General Surgery

## 2018-09-23 ENCOUNTER — Telehealth: Payer: Self-pay | Admitting: Family Medicine

## 2018-09-23 ENCOUNTER — Ambulatory Visit: Payer: Commercial Managed Care - PPO | Admitting: Family Medicine

## 2018-09-23 DIAGNOSIS — N529 Male erectile dysfunction, unspecified: Secondary | ICD-10-CM

## 2018-09-23 NOTE — Telephone Encounter (Signed)
Patient would like to have his medication refilled for Sileeneafil ( like viagra)

## 2018-09-23 NOTE — Telephone Encounter (Signed)
Patient is requesting a refill of the following medications: Requested Prescriptions    No prescriptions requested or ordered in this encounter    Date of patient request: 09/23/18 Last office visit: 11/01/17 Date of last refill: 03/21/18 Last refill amount: #5 Follow up time period per chart: o

## 2018-09-24 MED ORDER — SILDENAFIL CITRATE 100 MG PO TABS: ORAL_TABLET | ORAL | 0 refills | Status: DC

## 2018-09-24 NOTE — Telephone Encounter (Signed)
Discussed in April 2019. Refilled once, but follow visit for further refills.

## 2018-09-25 ENCOUNTER — Other Ambulatory Visit: Payer: Self-pay

## 2018-09-25 ENCOUNTER — Telehealth (INDEPENDENT_AMBULATORY_CARE_PROVIDER_SITE_OTHER): Payer: Commercial Managed Care - PPO | Admitting: Family Medicine

## 2018-09-25 ENCOUNTER — Encounter: Payer: Self-pay | Admitting: Family Medicine

## 2018-09-25 DIAGNOSIS — N529 Male erectile dysfunction, unspecified: Secondary | ICD-10-CM

## 2018-09-25 DIAGNOSIS — R350 Frequency of micturition: Secondary | ICD-10-CM

## 2018-09-25 MED ORDER — SILDENAFIL CITRATE 100 MG PO TABS: ORAL_TABLET | ORAL | 3 refills | Status: DC

## 2018-09-25 NOTE — Patient Instructions (Signed)
I refilled Viagra same dose for now, but please schedule an appointment for a physical in the next few months.  Please contact [facility] for follow-up appointment due to continued urinary symptoms.  They had requested a 4 to 6-week follow-up when I saw you last year.  Let me know if you need their number.  If any new side effects with medication, return right away to discuss further.  Let me know if there are questions.  Take care.

## 2018-09-25 NOTE — Telephone Encounter (Signed)
Had apt today.

## 2018-09-25 NOTE — Progress Notes (Signed)
Pt c/o medication refill of sildenafil. No travel.

## 2018-09-25 NOTE — Progress Notes (Signed)
Virtual Visit via Telephone Note  I connected with Jacob Logan on 09/25/18 at 1:43 PM by telephone and verified that I am speaking with the correct person using two identifiers.   I discussed the limitations, risks, security and privacy concerns of performing an evaluation and management service by telephone and the availability of in person appointments. I also discussed with the patient that there may be a patient responsible charge related to this service. The patient expressed understanding and agreed to proceed, consent obtained  Chief complaint:  Erectile dysfunction.   History of Present Illness:  ED: Last discussed in April 2019.  Taking 1/2 pill as needed. Denies HA/flushing, blue discoloration of vision, no hearing changes. No HA/CP/dyspnea.  Has not noticed.  Sometimes shiver getting out of shower, no fever.  No dizziness, no dyspnea. prior dizziness has resolved. Some dyspnea up hill or several flights of steps - same as last year. No chest pains.  Some cold sensation at times, with numb fingertips in colder weather only.  EF 59% in 04/2015.   Prior fall and hurt ribs 8 weeks ago - getting better.   appt with urology in July 2019.  Overactive bladder with BPH, BOO.  Recommended alfuzosin and 4-6 week follow up. Initially took meds, off now. Urinating every 2 hours, but stable for him. Stable renal tones and prostatic stones. Nocturia 1 time per night.   Had hemorrhoid surgery last fall - doing better.    Patient Active Problem List   Diagnosis Date Noted  . Dyspnea on exertion, possible anginal equivalent 04/07/2015  . CKD (chronic kidney disease), stage III ([place]) 11/22/2014  . Renal stone 11/19/2014  . Malnutrition of moderate degree ([place]) 11/19/2014  . Scoliosis (and kyphoscoliosis), idiopathic 10/18/2014   Past Medical History:  Diagnosis Date  . Anemia   . Bilateral plantar fasciitis   . Hemorrhoids   . History of hydrocele   . History of kidney stones    . Malnourished ([place])   . 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 Wears glasses    Past Surgical History:  Procedure Laterality Date  . COLONOSCOPY    . HYDROCELE EXCISION    . KIDNEY STONE SURGERY    . TONSILLECTOMY    . TRANSANAL HEMORRHOIDAL DEARTERIALIZATION N/A 05/02/2018   Procedure: TRANSANAL HEMORRHOIDAL DEARTERIALIZATION;  Surgeon: Leighton Ruff, MD;  Location: [facility];  Service: General;  Laterality: N/A;   No Known Allergies Prior to Admission medications   Medication Sig Start Date End Date Taking? Authorizing Provider  sildenafil (VIAGRA) 100 MG tablet TAKE 1/2 TO 1 TABLET BY MOUTH DAILY AS NEEDED FOR ERECTILE DYSFUNCTION 09/24/18  Yes [name], MD  diazepam (VALIUM) 5 MG tablet Take 1 tablet (5 mg total) by mouth every 6 (six) hours as needed (inability to urinate). Patient not taking: Reported on 09/25/2018 05/02/18 77/82/42  Leighton Ruff, MD  oxyCODONE (OXY IR/ROXICODONE) 5 MG immediate release tablet Take 1-2 tablets (5-10 mg total) by mouth every 6 (six) hours as needed. Patient not taking: Reported on 09/25/2018 35/36/14   Leighton Ruff, MD   Social History   Socioeconomic History  . Marital status: Single    Spouse name: Not on file  . Number of children: Not on file  . Years of education: Not on file  . Highest education level: Not on file  Occupational History  . Not on file  Social Needs  . Financial resource strain: Not on file  . Food insecurity:  Worry: Not on file    Inability: Not on file  . Transportation needs:    Medical: Not on file    Non-medical: Not on file  Tobacco Use  . Smoking status: Never Smoker  . Smokeless tobacco: Never Used  Substance and Sexual Activity  . Alcohol use: Yes    Alcohol/week: 1.0 standard drinks    Types: 1 Standard drinks or equivalent per week  . Drug use: No  . Sexual activity: Never  Lifestyle  . Physical activity:    Days per week: Not on file    Minutes per  session: Not on file  . Stress: Not on file  Relationships  . Social connections:    Talks on phone: Not on file    Gets together: Not on file    Attends religious service: Not on file    Active member of club or organization: Not on file    Attends meetings of clubs or organizations: Not on file    Relationship status: Not on file  . Intimate partner violence:    Fear of current or ex partner: Not on file    Emotionally abused: Not on file    Physically abused: Not on file    Forced sexual activity: Not on file  Other Topics Concern  . Not on file  Social History Narrative  . Not on file     Observations/Objective: No distress on phone  Assessment and Plan: Erectile dysfunction, unspecified erectile dysfunction type - Plan: sildenafil (VIAGRA) 100 MG tablet  - viagra Rx given - use lowest effective dose. Side effects discussed (including but not limited to headache/flushing, blue discoloration of vision, possible vascular steal and risk of cardiac effects if underlying unknown coronary artery disease, and permanent sensorineural hearing loss). Understanding expressed.  Urinary frequency  -History of overactive bladder, BPH with lower urinary tract symptoms.  Continued daytime frequency, recommended follow-up with urology as planned last year.  No new medications started at this time.  Annual exam/physical next few months  Follow Up Instructions:    I discussed the assessment and treatment plan with the patient. The patient was provided an opportunity to ask questions and all were answered. The patient agreed with the plan and demonstrated an understanding of the instructions.   The patient was advised to call back or seek an in-person evaluation if the symptoms worsen or if the condition fails to improve as anticipated.  I provided 10 minutes of non-face-to-face time during this encounter.  Signed,   [name], MD [facility].   09/25/18

## 2019-01-28 ENCOUNTER — Other Ambulatory Visit: Payer: Self-pay

## 2019-01-28 ENCOUNTER — Ambulatory Visit (INDEPENDENT_AMBULATORY_CARE_PROVIDER_SITE_OTHER): Payer: Commercial Managed Care - PPO

## 2019-01-28 ENCOUNTER — Ambulatory Visit (INDEPENDENT_AMBULATORY_CARE_PROVIDER_SITE_OTHER): Payer: Commercial Managed Care - PPO | Admitting: Emergency Medicine

## 2019-01-28 ENCOUNTER — Encounter: Payer: Self-pay | Admitting: Emergency Medicine

## 2019-01-28 VITALS — BP 110/68 | HR 68 | Temp 97.7°F | Resp 16 | Ht 69.0 in | Wt 138.0 lb

## 2019-01-28 DIAGNOSIS — S52551S Other extraarticular fracture of lower end of right radius, sequela: Secondary | ICD-10-CM | POA: Diagnosis not present

## 2019-01-28 DIAGNOSIS — S52571A Other intraarticular fracture of lower end of right radius, initial encounter for closed fracture: Secondary | ICD-10-CM

## 2019-01-28 DIAGNOSIS — S6991XA Unspecified injury of right wrist, hand and finger(s), initial encounter: Secondary | ICD-10-CM

## 2019-01-28 NOTE — Progress Notes (Signed)
Jacob Logan 68 y.o.   Chief Complaint  Patient presents with  . Wrist Pain    right wrist/ happen last night, patient jumped a fenced and fell    HISTORY OF PRESENT ILLNESS: This is a 68 y.o. male complaining of right wrist injury sustained last night after fall.  Complaining of bruising, swelling, pain.  No other injuries.  HPI   Prior to Admission medications   Medication Sig Start Date End Date Taking? Authorizing Provider  sildenafil (VIAGRA) 100 MG tablet TAKE 1/2 TO 1 TABLET BY MOUTH DAILY AS NEEDED FOR ERECTILE DYSFUNCTION 09/25/18  Yes [name], MD  diazepam (VALIUM) 5 MG tablet Take 1 tablet (5 mg total) by mouth every 6 (six) hours as needed (inability to urinate). Patient not taking: Reported on 09/25/2018 05/02/18 08/65/78  Leighton Ruff, MD  oxyCODONE (OXY IR/ROXICODONE) 5 MG immediate release tablet Take 1-2 tablets (5-10 mg total) by mouth every 6 (six) hours as needed. Patient not taking: Reported on 09/25/2018 46/96/29   Leighton Ruff, MD    No Known Allergies  Patient Active Problem List   Diagnosis Date Noted  . Dyspnea on exertion, possible anginal equivalent 04/07/2015  . CKD (chronic kidney disease), stage III ([place]) 11/22/2014  . Renal stone 11/19/2014  . Malnutrition of moderate degree ([place]) 11/19/2014  . Scoliosis (and kyphoscoliosis), idiopathic 10/18/2014    Past Medical History:  Diagnosis Date  . Anemia   . Bilateral plantar fasciitis   . Hemorrhoids   . History of hydrocele   . History of kidney stones   . Malnourished ([place])   . 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 Wears glasses     Past Surgical History:  Procedure Laterality Date  . COLONOSCOPY    . HYDROCELE EXCISION    . KIDNEY STONE SURGERY    . TONSILLECTOMY    . TRANSANAL HEMORRHOIDAL DEARTERIALIZATION N/A 05/02/2018   Procedure: TRANSANAL HEMORRHOIDAL DEARTERIALIZATION;  Surgeon: Leighton Ruff, MD;  Location: [facility];  Service: General;   Laterality: N/A;    Social History   Socioeconomic History  . Marital status: Single    Spouse name: Not on file  . Number of children: Not on file  . Years of education: Not on file  . Highest education level: Not on file  Occupational History  . Not on file  Social Needs  . Financial resource strain: Not on file  . Food insecurity    Worry: Not on file    Inability: Not on file  . Transportation needs    Medical: Not on file    Non-medical: Not on file  Tobacco Use  . Smoking status: Never Smoker  . Smokeless tobacco: Never Used  Substance and Sexual Activity  . Alcohol use: Yes    Alcohol/week: 1.0 standard drinks    Types: 1 Standard drinks or equivalent per week  . Drug use: No  . Sexual activity: Never  Lifestyle  . Physical activity    Days per week: Not on file    Minutes per session: Not on file  . Stress: Not on file  Relationships  . Social Herbalist on phone: Not on file    Gets together: Not on file    Attends religious service: Not on file    Active member of club or organization: Not on file    Attends meetings of clubs or organizations: Not on file    Relationship status: Not on file  .  Intimate partner violence    Fear of current or ex partner: Not on file    Emotionally abused: Not on file    Physically abused: Not on file    Forced sexual activity: Not on file  Other Topics Concern  . Not on file  Social History Narrative  . Not on file    Family History  Problem Relation Age of Onset  . Hypertension Mother   . Uterine cancer Mother   . Obesity Mother   . Hyperlipidemia Mother   . Heart disease Mother   . Diabetes Mother   . Cancer Mother   . Obesity Father   . Hypertension Father   . Diabetes Father   . Heart disease Father   . Hyperlipidemia Father   . Heart attack Father        First one in his 3s  . Stroke Brother   . Obesity Brother   . Alzheimer's disease Brother   . Diabetes Brother   . Heart disease  Brother   . Hyperlipidemia Brother   . Hypertension Brother   . Cataracts Maternal Grandmother      Review of Systems  Constitutional: Negative for fever.  HENT: Negative.  Negative for congestion and sore throat.   Respiratory: Negative.  Negative for cough and shortness of breath.   Cardiovascular: Negative.  Negative for chest pain.  Gastrointestinal: Negative for abdominal pain, nausea and vomiting.  Genitourinary: Negative.   Skin: Negative.  Negative for rash.  Neurological: Negative.  Negative for dizziness and headaches.  All other systems reviewed and are negative.  Vitals:   01/28/19 0835  BP: 110/68  Pulse: 68  Resp: 16  Temp: 97.7 F (36.5 C)  SpO2: 100%     Physical Exam Vitals signs reviewed.  Constitutional:      Appearance: Normal appearance.  HENT:     Head: Normocephalic.  Eyes:     Extraocular Movements: Extraocular movements intact.     Pupils: Pupils are equal, round, and reactive to light.  Neck:     Musculoskeletal: Normal range of motion.  Cardiovascular:     Rate and Rhythm: Normal rate and regular rhythm.     Pulses: Normal pulses.     Heart sounds: Normal heart sounds.  Pulmonary:     Effort: Pulmonary effort is normal.  Musculoskeletal:     Comments: Right wrist: Positive swelling.  Positive ecchymosis on volar surface.  Limited range of motion.  Distal hand within normal limits.  Suspected fracture of distal radius. Right elbow and right shoulder within normal limits.  Skin:    General: Skin is warm and dry.     Capillary Refill: Capillary refill takes less than 2 seconds.  Neurological:     General: No focal deficit present.     Mental Status: He is alert and oriented to person, place, and time.  Psychiatric:        Mood and Affect: Mood normal.        Behavior: Behavior normal.     Dg Wrist Complete Right  Result Date: 01/28/2019 CLINICAL DATA:  Recent trauma with right wrist pain, initial encounter EXAM: RIGHT WRIST -  COMPLETE 3+ VIEW COMPARISON:  None. FINDINGS: There is lucency in the posterior aspect of the distal radius not seen on any of the other views suspicious for a minimally displaced fracture posteriorly. Correlation to point tenderness is recommended. No other focal abnormality is noted. IMPRESSION: Lucency in the posterior aspect of the distal radius  suspicious for undisplaced fracture. Correlate to point tenderness. Electronically Signed   By: Inez Catalina M.D.   On: 01/28/2019 08:56    ASSESSMENT & PLAN: L[place]lann was seen today for wrist pain.  Diagnoses and all orders for this visit:  Injury of right wrist, initial encounter -     DG Wrist Complete Right; Future -     Splint wrist  Other closed intra-articular fracture of distal end of right radius, initial encounter -     Splint wrist -     Ambulatory referral to Orthopedic Surgery  Closed extraarticular fracture of distal radius, right, sequela  -     Splint wrist    Patient Instructions       If you have lab work done today you will be contacted with your lab results within the next 2 weeks.  If you have not heard from [place] then please contact us. The fastest way to get your results is to register for My Chart.   IF you received an x-ray today, you will receive an invoice from [facility]. Please contact [facility] at [phone] with questions or concerns regarding your invoice.   IF you received labwork today, you will receive an invoice from [facility]. Please contact [facility] at [phone] with questions or concerns regarding your invoice.   Our billing staff will not be able to assist you with questions regarding bills from these companies.  You will be contacted with the lab results as soon as they are available. The fastest way to get your results is to activate your My Chart account. Instructions are located on the last page of this paperwork. If you have not heard from [place] regarding the results in 2  weeks, please contact this office.      Wrist Fracture Treated With Immobilization  A wrist fracture is a break or crack in one of the bones of your wrist. Your wrist is made of eight small bones at the palm of your hand (carpal bones) and two long bones of the forearm (radius and ulna). A broken wrist is often treated by wearing a cast, splint, or sling (immobilization). This holds the broken pieces in place so they can heal. What are the causes?  A direct blow to the wrist.  Falling on an outstretched hand.  Trauma, such as a car accident or a fall. What increases the risk?  Doing contact sports or high-risk sports such as skiing, biking, and ice skating.  Taking steroid medicines.  Smoking.  Being male.  Being Caucasian.  Drinking more than three drinks that contain alcohol per day.  Having a condition that weakens the bones (osteoporosis).  Being older.  Having had other fractures in the past. What are the signs or symptoms?  Pain.  Swelling.  Bruising.  Not being able to move the wrist normally.  The wrist hanging in an odd position or looking oddly shaped. How is this treated? Treatment involves wearing a cast, splint, or sling. Once the injury has healed enough, you may begin doing range-of-motion exercises. You may also be prescribed pain medicine. Follow these instructions at home: If you have a cast:  Do not stick anything inside the cast to scratch your skin.  Check the skin around the cast every day. Tell your doctor if you have any concerns.  You may put lotion on dry skin around the edges of the cast. Do not put lotion on the skin under the cast.  Keep the cast clean and  dry. If you have a splint or sling:  Wear the splint or sling as told by your doctor. Remove it only as told by your doctor.  Loosen the splint or sling if your fingers: ? Tingle. ? Get numb. ? Turn cold and blue.  Keep the splint or sling clean and dry. Bathing   Do not take baths, swim, or use a hot tub until your doctor approves. Ask your doctor if you may take showers. You may only be allowed to take sponge baths.  If your cast, splint, or sling is not waterproof: ? Do not let it get wet. ? Cover it with a watertight covering when you take a bath or shower.  If you have a sling, remove it for bathing only if your doctor says this is okay. Managing pain, stiffness, and swelling   If told, put ice on the injured area. ? If you have a removable splint or sling, remove it as told by your doctor. ? Put ice in a plastic bag. ? Place a towel between your skin and the bag or between your cast and the bag. ? Leave the ice on for 20 minutes, 2-3 times a day.  Move your fingers often.  Raise (elevate) the injured area above the level of your heart while you are sitting or lying down. Driving  Do not drive or use heavy machinery while taking prescription pain medicine.  Ask your doctor when it is safe to drive if you have a cast, splint, or sling on your wrist. Activity  Return to your normal activities as told by your doctor. Ask your doctor what activities are safe for you.  Do not lift with your injured wrist until your doctor says this is okay.  Do range-of-motion exercises only as told by your doctor. General instructions  Do not put pressure on any part of the cast or splint until it is fully hardened. This may take many hours.  Take over-the-counter and prescription medicines only as told by your doctor.  Do not use any products that contain nicotine or tobacco, such as cigarettes, e-cigarettes, and chewing tobacco. These can delay bone healing. If you need help quitting, ask your doctor.  If you were prescribed pain medicine, take steps to prevent or treat trouble pooping. Your doctor may suggest that you: ? Drink enough fluid to keep your pee (urine) pale yellow. ? Take over-the-counter or prescription medicines. ? Eat foods that are  high in fiber. These include beans, whole grains, and fresh fruits and vegetables. ? Limit foods that are high in fat and sugar. These include fried or sweet foods.  Keep all follow-up visits as told by your doctor. This is important. Contact a doctor if:  Your cast, splint, or sling is damaged or loose.  You have any new pain, swelling, or bruising.  Your pain, swelling, and bruising do not get better.  You have a fever.  You have chills. Get help right away if:  Your skin or fingers on your injured arm turn blue or gray.  Your arm feels cold or gets numb.  You have very bad pain in your injured wrist. Summary  A wrist fracture is a break or crack in one of the bones of your wrist.  A broken wrist is often treated by wearing a cast, splint, or sling.  You should check the skin around a cast every day.  Remove a splint or sling only as told by your doctor. This information is  not intended to replace advice given to you by your health care provider. Make sure you discuss any questions you have with your health care provider. Document Released: 11/15/2007 Document Revised: 10/30/2017 Document Reviewed: 10/30/2017 Elsevier Patient Education  2020 [employer]      Agustina Caroli, MD [facility]

## 2019-01-28 NOTE — Patient Instructions (Addendum)
If you have lab work done today you will be contacted with your lab results within the next 2 weeks.  If you have not heard from [place] then please contact us. The fastest way to get your results is to register for My Chart.   IF you received an x-ray today, you will receive an invoice from [facility]. Please contact [facility] at [phone] with questions or concerns regarding your invoice.   IF you received labwork today, you will receive an invoice from [facility]. Please contact [facility] at [phone] with questions or concerns regarding your invoice.   Our billing staff will not be able to assist you with questions regarding bills from these companies.  You will be contacted with the lab results as soon as they are available. The fastest way to get your results is to activate your My Chart account. Instructions are located on the last page of this paperwork. If you have not heard from [place] regarding the results in 2 weeks, please contact this office.      Wrist Fracture Treated With Immobilization  A wrist fracture is a break or crack in one of the bones of your wrist. Your wrist is made of eight small bones at the palm of your hand (carpal bones) and two long bones of the forearm (radius and ulna). A broken wrist is often treated by wearing a cast, splint, or sling (immobilization). This holds the broken pieces in place so they can heal. What are the causes?  A direct blow to the wrist.  Falling on an outstretched hand.  Trauma, such as a car accident or a fall. What increases the risk?  Doing contact sports or high-risk sports such as skiing, biking, and ice skating.  Taking steroid medicines.  Smoking.  Being male.  Being Caucasian.  Drinking more than three drinks that contain alcohol per day.  Having a condition that weakens the bones (osteoporosis).  Being older.  Having had other fractures in the past. What are the signs or  symptoms?  Pain.  Swelling.  Bruising.  Not being able to move the wrist normally.  The wrist hanging in an odd position or looking oddly shaped. How is this treated? Treatment involves wearing a cast, splint, or sling. Once the injury has healed enough, you may begin doing range-of-motion exercises. You may also be prescribed pain medicine. Follow these instructions at home: If you have a cast:  Do not stick anything inside the cast to scratch your skin.  Check the skin around the cast every day. Tell your doctor if you have any concerns.  You may put lotion on dry skin around the edges of the cast. Do not put lotion on the skin under the cast.  Keep the cast clean and dry. If you have a splint or sling:  Wear the splint or sling as told by your doctor. Remove it only as told by your doctor.  Loosen the splint or sling if your fingers: ? Tingle. ? Get numb. ? Turn cold and blue.  Keep the splint or sling clean and dry. Bathing  Do not take baths, swim, or use a hot tub until your doctor approves. Ask your doctor if you may take showers. You may only be allowed to take sponge baths.  If your cast, splint, or sling is not waterproof: ? Do not let it get wet. ? Cover it with a watertight covering when you take a bath or shower.  If you  have a sling, remove it for bathing only if your doctor says this is okay. Managing pain, stiffness, and swelling   If told, put ice on the injured area. ? If you have a removable splint or sling, remove it as told by your doctor. ? Put ice in a plastic bag. ? Place a towel between your skin and the bag or between your cast and the bag. ? Leave the ice on for 20 minutes, 2-3 times a day.  Move your fingers often.  Raise (elevate) the injured area above the level of your heart while you are sitting or lying down. Driving  Do not drive or use heavy machinery while taking prescription pain medicine.  Ask your doctor when it is safe  to drive if you have a cast, splint, or sling on your wrist. Activity  Return to your normal activities as told by your doctor. Ask your doctor what activities are safe for you.  Do not lift with your injured wrist until your doctor says this is okay.  Do range-of-motion exercises only as told by your doctor. General instructions  Do not put pressure on any part of the cast or splint until it is fully hardened. This may take many hours.  Take over-the-counter and prescription medicines only as told by your doctor.  Do not use any products that contain nicotine or tobacco, such as cigarettes, e-cigarettes, and chewing tobacco. These can delay bone healing. If you need help quitting, ask your doctor.  If you were prescribed pain medicine, take steps to prevent or treat trouble pooping. Your doctor may suggest that you: ? Drink enough fluid to keep your pee (urine) pale yellow. ? Take over-the-counter or prescription medicines. ? Eat foods that are high in fiber. These include beans, whole grains, and fresh fruits and vegetables. ? Limit foods that are high in fat and sugar. These include fried or sweet foods.  Keep all follow-up visits as told by your doctor. This is important. Contact a doctor if:  Your cast, splint, or sling is damaged or loose.  You have any new pain, swelling, or bruising.  Your pain, swelling, and bruising do not get better.  You have a fever.  You have chills. Get help right away if:  Your skin or fingers on your injured arm turn blue or gray.  Your arm feels cold or gets numb.  You have very bad pain in your injured wrist. Summary  A wrist fracture is a break or crack in one of the bones of your wrist.  A broken wrist is often treated by wearing a cast, splint, or sling.  You should check the skin around a cast every day.  Remove a splint or sling only as told by your doctor. This information is not intended to replace advice given to you by  your health care provider. Make sure you discuss any questions you have with your health care provider. Document Released: 11/15/2007 Document Revised: 10/30/2017 Document Reviewed: 10/30/2017 Elsevier Patient Education  2020 [employer].

## 2019-01-29 ENCOUNTER — Encounter: Payer: Self-pay | Admitting: Orthopaedic Surgery

## 2019-01-29 ENCOUNTER — Ambulatory Visit (INDEPENDENT_AMBULATORY_CARE_PROVIDER_SITE_OTHER): Payer: Commercial Managed Care - PPO | Admitting: Orthopaedic Surgery

## 2019-01-29 VITALS — Ht 68.0 in | Wt 138.0 lb

## 2019-01-29 DIAGNOSIS — S52501A Unspecified fracture of the lower end of right radius, initial encounter for closed fracture: Secondary | ICD-10-CM

## 2019-01-29 NOTE — Progress Notes (Signed)
Office Visit Note   Patient: Jacob Logan           Date of Birth: [birth year]           MRN: [id number] Visit Date: 01/29/2019              Requested by: Horald Pollen, MD [place],  [place] [address] PCP: [name], MD   Assessment & Plan: Visit Diagnoses:  1. Closed fracture of distal end of right radius, unspecified fracture morphology, initial encounter     Plan: Impression is right wrist nondisplaced distal radius fracture.  The patient will continue wearing his removable plate to the right upper extremity.  He will be nonweightbearing for 3 weeks.  He will continue with ice and elevation for pain and swelling.  Follow-up with [place] in 3 weeks time for repeat evaluation and 3 view x-rays of the right wrist.  Call with concerns or questions in meantime.  Follow-Up Instructions: Return in about 3 weeks (around 02/19/2019).   Orders:  No orders of the defined types were placed in this encounter.  No orders of the defined types were placed in this encounter.     Procedures: No procedures performed   Clinical Data: No additional findings.   Subjective: Chief Complaint  Patient presents with  . Right Wrist - Pain    DOI 01/27/2019    HPI patient is a pleasant 68 year old gentleman who presents our clinic today with right wrist pain.  Approximately 2 days ago on 01/27/2019, he was trapped inside a storage unit when he decided to climb over a barbed wire fence causing him to lose balance and fall landing on his feet but rolling onto his right wrist.  He was seen in an outpatient setting where x-rays were obtained.  X-rays showed a questionable distal radius fracture.  He was placed in a removable short arm splint.  He comes in today for further evaluation treatment recommendation.  He is having pain to the distal radius.  Worse with supination of the forearm.  The pain is not significant enough to take any over-the-counter pain medications.  He denies any  numbness, tingling or burning.  Review of Systems as detailed in HPI.  All others reviewed and are negative.   Objective: Vital Signs: Ht 5\' 8"  (1.727 m)   Wt 138 lb (62.6 kg)   BMI 20.98 kg/m   Physical Exam well-developed and well-nourished gentleman in no acute distress.  Alert and oriented x3.  Ortho Exam examination of his right wrist reveals moderate tenderness along the distal radius.  He does have moderate ecchymosis to the volar wrist.  Increased pain with supination of the forearm.  He is neurovascularly intact distally.  Specialty Comments:  No specialty comments available.  Imaging: No new imaging   PMFS History: Patient Active Problem List   Diagnosis Date Noted  . CKD (chronic kidney disease), stage III ([place]) 11/22/2014  . Renal stone 11/19/2014  . Malnutrition of moderate degree ([place]) 11/19/2014  . Scoliosis (and kyphoscoliosis), idiopathic 10/18/2014   Past Medical History:  Diagnosis Date  . Anemia   . Bilateral plantar fasciitis   . Hemorrhoids   . History of hydrocele   . History of kidney stones   . Malnourished ([place])   . 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 Wears glasses     Family History  Problem Relation Age of Onset  . Hypertension Mother   . Uterine cancer Mother   .  Obesity Mother   . Hyperlipidemia Mother   . Heart disease Mother   . Diabetes Mother   . Cancer Mother   . Obesity Father   . Hypertension Father   . Diabetes Father   . Heart disease Father   . Hyperlipidemia Father   . Heart attack Father        First one in his 24s  . Stroke Brother   . Obesity Brother   . Alzheimer's disease Brother   . Diabetes Brother   . Heart disease Brother   . Hyperlipidemia Brother   . Hypertension Brother   . Cataracts Maternal Grandmother     Past Surgical History:  Procedure Laterality Date  . COLONOSCOPY    . HYDROCELE EXCISION    . KIDNEY STONE SURGERY    . TONSILLECTOMY    . TRANSANAL HEMORRHOIDAL  DEARTERIALIZATION N/A 05/02/2018   Procedure: TRANSANAL HEMORRHOIDAL DEARTERIALIZATION;  Surgeon: Leighton Ruff, MD;  Location: [facility];  Service: General;  Laterality: N/A;   Social History   Occupational History  . Not on file  Tobacco Use  . Smoking status: Never Smoker  . Smokeless tobacco: Never Used  Substance and Sexual Activity  . Alcohol use: Yes    Alcohol/week: 1.0 standard drinks    Types: 1 Standard drinks or equivalent per week  . Drug use: No  . Sexual activity: Never

## 2019-02-10 ENCOUNTER — Other Ambulatory Visit: Payer: Self-pay

## 2019-02-10 ENCOUNTER — Ambulatory Visit (INDEPENDENT_AMBULATORY_CARE_PROVIDER_SITE_OTHER): Payer: Commercial Managed Care - PPO | Admitting: Family Medicine

## 2019-02-10 ENCOUNTER — Encounter: Payer: Self-pay | Admitting: Family Medicine

## 2019-02-10 VITALS — BP 102/60 | HR 77 | Temp 98.2°F | Ht 68.0 in | Wt 138.6 lb

## 2019-02-10 DIAGNOSIS — R1013 Epigastric pain: Secondary | ICD-10-CM

## 2019-02-10 DIAGNOSIS — R350 Frequency of micturition: Secondary | ICD-10-CM | POA: Diagnosis not present

## 2019-02-10 MED ORDER — OMEPRAZOLE 20 MG PO CPDR: 20.0000 mg | DELAYED_RELEASE_CAPSULE | Freq: Every day | ORAL | 1 refills | Status: DC

## 2019-02-10 NOTE — Progress Notes (Signed)
Subjective:    Patient ID: Jacob Logan, male    DOB: 1950-07-30, 68 y.o.   MRN: [id number]  HPI Jacob Logan is a 68 y.o. male Presents today for: Chief Complaint  Patient presents with  . left abdominal pain    off and on for the several months (feels like cramp) now is worse    Left-sided abdominal pain: Started about a year ago. Intermittent. Grabbing sensation in upper stomach with leaning over at times, improved with standing up.   Worse past month - constant ache.  No associated fever, nausea, vomiting.  Normal BM's, last one today - denies hard stools.  No weight loss, fever, or night sweats.  No new urinary symptoms - chronic frequency.  No blood in stools, no recent anal leakage since surgery in 04/2018.  No heartburn/belching.  No change with meals or positional changes.  No dark/tarry stools.  No dyspnea, no cough.   Tx: none.   Past few weeks few motrin/ibuprofen for R wrist injury - no change in symptoms.  Referred to urology last year for urinary frequency.  In July 2019.  Thought to have bladder outlet obstruction, BPH, overactive bladder.  Recommended increased water intake, alfuzocin prescribed for overactive bladder symptoms and outlet obstruction. Planned follow up with urology, but has not seen in follow up. Off alfuzosin - not sure it helped.   Gastroenterology note from July 11 noted  -reported normal colonoscopy but over 10 years previous, repeat colonoscopy was declined.  Stool testing not likely thought to be helpful given potential positive with inflamed hemorrhoids.   Seen by general surgery in August 2019 for years of bleeding and rectal drainage.   Anoscopy performed at General surgery, multiple internal hemorrhoids, prolapsed hemorrhoids.  Options were discussed including reduction hemorrhoids after bowel movements manually, hemorrhoid dearterialization versus hemorrhoidectomy.  Planned on manual reduction only, initially, then had transanal  hemorrhoidal dearterialization performed in November 2019.  improved sx's as above.  10 addl min chart review.   Wt Readings from Last 3 Encounters:  02/10/19 138 lb 9.6 oz (62.9 kg)  01/29/19 138 lb (62.6 kg)  01/28/19 138 lb (62.6 kg)    Depression screen [facility] 2/9 02/10/2019 09/25/2018 11/01/2017 10/18/2017 09/27/2017  Decreased Interest 0 0 0 0 0  Down, Depressed, Hopeless 0 0 0 0 0  PHQ - 2 Score 0 0 0 0 0  Altered sleeping - - - - -  Tired, decreased energy - - - - -  Change in appetite - - - - -  Feeling bad or failure about yourself  - - - - -  Trouble concentrating - - - - -  Moving slowly or fidgety/restless - - - - -  Suicidal thoughts - - - - -  PHQ-9 Score - - - - -  Difficult doing work/chores - - - - -    Patient Active Problem List   Diagnosis Date Noted  . CKD (chronic kidney disease), stage III ([place]) 11/22/2014  . Renal stone 11/19/2014  . Malnutrition of moderate degree ([place]) 11/19/2014  . Scoliosis (and kyphoscoliosis), idiopathic 10/18/2014   Past Medical History:  Diagnosis Date  . Anemia   . Bilateral plantar fasciitis   . Hemorrhoids   . History of hydrocele   . History of kidney stones   . Malnourished ([place])   . 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 Wears glasses    Past Surgical History:  Procedure Laterality Date  . COLONOSCOPY    .  HYDROCELE EXCISION    . KIDNEY STONE SURGERY    . TONSILLECTOMY    . TRANSANAL HEMORRHOIDAL DEARTERIALIZATION N/A 05/02/2018   Procedure: TRANSANAL HEMORRHOIDAL DEARTERIALIZATION;  Surgeon: Leighton Ruff, MD;  Location[facility];  Service: General;  Laterality: N/A;   No Known Allergies Prior to Admission medications   Medication Sig Start Date End Date Taking? Authorizing Provider  sildenafil (VIAGRA) 100 MG tablet TAKE 1/2 TO 1 TABLET BY MOUTH DAILY AS NEEDED FOR ERECTILE DYSFUNCTION 09/25/18   [name], MD   Social History   Socioeconomic History  . Marital status:  Single    Spouse name: Not on file  . Number of children: Not on file  . Years of education: Not on file  . Highest education level: Not on file  Occupational History  . Not on file  Social Needs  . Financial resource strain: Not on file  . Food insecurity    Worry: Not on file    Inability: Not on file  . Transportation needs    Medical: Not on file    Non-medical: Not on file  Tobacco Use  . Smoking status: Never Smoker  . Smokeless tobacco: Never Used  Substance and Sexual Activity  . Alcohol use: Yes    Alcohol/week: 1.0 standard drinks    Types: 1 Standard drinks or equivalent per week  . Drug use: No  . Sexual activity: Never  Lifestyle  . Physical activity    Days per week: Not on file    Minutes per session: Not on file  . Stress: Not on file  Relationships  . Social Herbalist on phone: Not on file    Gets together: Not on file    Attends religious service: Not on file    Active member of club or organization: Not on file    Attends meetings of clubs or organizations: Not on file    Relationship status: Not on file  . Intimate partner violence    Fear of current or ex partner: Not on file    Emotionally abused: Not on file    Physically abused: Not on file    Forced sexual activity: Not on file  Other Topics Concern  . Not on file  Social History Narrative  . Not on file    Review of Systems Per HPI.     Objective:   Physical Exam Vitals signs reviewed.  Constitutional:      Appearance: He is well-developed.  HENT:     Head: Normocephalic and atraumatic.  Eyes:     Pupils: Pupils are equal, round, and reactive to light.  Neck:     Vascular: No carotid bruit or JVD.  Cardiovascular:     Rate and Rhythm: Normal rate and regular rhythm.     Heart sounds: Normal heart sounds. No murmur.  Pulmonary:     Effort: Pulmonary effort is normal.     Breath sounds: Normal breath sounds. No rales.  Abdominal:     General: There is no  distension.     Tenderness: There is abdominal tenderness (Locates area of discomfort around epigastric, but minimal to no tenderness on exam.  Ribs nontender.  No appreciable hernia or change in appearance with Valsalva/sitting up.  Skin intact without rash.).     Hernia: No hernia is present.  Skin:    General: Skin is warm and dry.  Neurological:     Mental Status: He is alert and  oriented to person, place, and time.    Vitals:   02/10/19 1702  BP: 102/60  Pulse: 77  Temp: 98.2 F (36.8 C)  TempSrc: Oral  SpO2: 98%  Weight: 138 lb 9.6 oz (62.9 kg)  Height: 5\' 8"  (1.727 m)       Assessment & Plan:   Jacob Logan is a 68 y.o. male Abdominal pain, epigastric - Plan: CBC, Comprehensive metabolic panel, Lipase, omeprazole (PRILOSEC) 20 MG capsule  -Differential includes gastritis versus muscular cause with positional change initially.  Did not appreciate hernia, but that is also in differential.  Potentially overdue for colonoscopy.  -Start omeprazole once per day, check CMP, lipase but less likely liver/pancreatic cause.  -Recommended he follow up with gastroenterology  as potentially overdue for  colonoscopy.  Can discuss abdominal pain with GI as well.  Follow-up to be determined by relief with omeprazole and GI eval.  ER/RTC precautions if acute worsening or persistent symptoms. Consider imaging if persistent.   Urinary frequency  -Thought to have BPH with lower urinary symptoms, bladder outlet obstruction, overactive bladder.  Reports minimal change with alfuzosin prescribed previously.  Recommend he follow-up with urology to discuss other treatment options.   Meds ordered this encounter  Medications  . omeprazole (PRILOSEC) 20 MG capsule    Sig: Take 1 capsule (20 mg total) by mouth daily.    Dispense:  30 capsule    Refill:  1   Patient Instructions    For overactive bladder, prostate issues/frequent urination - please call [facility] for follow up. [phone].    I would recommend following up with   If you have lab work done today you will be contacted with your lab results within the next 2 weeks.  If you have not heard from [place] then please contact us. The fastest way to get your results is to register for My Chart.   IF you received an x-ray today, you will receive an invoice from [facility]. Please contact [facility] at [phone] with questions or concerns regarding your invoice.   IF you received labwork today, you will receive an invoice from [place]. Please contact [facility] at [phone] with questions or concerns regarding your invoice.   Our billing staff will not be able to assist you with questions regarding bills from these companies.  You will be contacted with the lab results as soon as they are available. The fastest way to get your results is to activate your My Chart account. Instructions are located on the last page of this paperwork. If you have not heard from [place] regarding the results in 2 weeks, please contact this office.       Signed,   [name], MD [facility] [place].  02/11/19 9:59 PM

## 2019-02-10 NOTE — Patient Instructions (Addendum)
  For overactive bladder, prostate issues/frequent urination - please call [facility] for follow up. [phone].   I would recommend following up with   If you have lab work done today you will be contacted with your lab results within the next 2 weeks.  If you have not heard from [place] then please contact us. The fastest way to get your results is to register for My Chart.   IF you received an x-ray today, you will receive an invoice from [facility]. Please contact [facility] at [phone] with questions or concerns regarding your invoice.   IF you received labwork today, you will receive an invoice from [place]. Please contact [facility] at [phone] with questions or concerns regarding your invoice.   Our billing staff will not be able to assist you with questions regarding bills from these companies.  You will be contacted with the lab results as soon as they are available. The fastest way to get your results is to activate your My Chart account. Instructions are located on the last page of this paperwork. If you have not heard from [place] regarding the results in 2 weeks, please contact this office.

## 2019-02-11 ENCOUNTER — Encounter: Payer: Self-pay | Admitting: Family Medicine

## 2019-02-11 LAB — COMPREHENSIVE METABOLIC PANEL
ALT: 11 IU/L (ref 0–44)
AST: 22 IU/L (ref 0–40)
Albumin/Globulin Ratio: 2 (ref 1.2–2.2)
Albumin: 4.5 g/dL (ref 3.8–4.8)
Alkaline Phosphatase: 85 IU/L (ref 39–117)
BUN/Creatinine Ratio: 17 (ref 10–24)
BUN: 20 mg/dL (ref 8–27)
Bilirubin Total: 0.4 mg/dL (ref 0.0–1.2)
CO2: 28 mmol/L (ref 20–29)
Calcium: 9.4 mg/dL (ref 8.6–10.2)
Chloride: 105 mmol/L (ref 96–106)
Creatinine, Ser: 1.15 mg/dL (ref 0.76–1.27)
GFR calc Af Amer: 76 mL/min/{1.73_m2} (ref >59)
GFR calc non Af Amer: 66 mL/min/{1.73_m2} (ref >59)
Globulin, Total: 2.2 g/dL (ref 1.5–4.5)
Glucose: 97 mg/dL (ref 65–99)
Potassium: 4.6 mmol/L (ref 3.5–5.2)
Sodium: 144 mmol/L (ref 134–144)
Total Protein: 6.7 g/dL (ref 6.0–8.5)

## 2019-02-11 LAB — CBC
Hematocrit: 43.3 % (ref 37.5–51.0)
Hemoglobin: 14.5 g/dL (ref 13.0–17.7)
MCH: 30.2 pg (ref 26.6–33.0)
MCHC: 33.5 g/dL (ref 31.5–35.7)
MCV: 90 fL (ref 79–97)
Platelets: 190 10*3/uL (ref 150–450)
RBC: 4.8 x10E6/uL (ref 4.14–5.80)
RDW: 12.4 % (ref 11.6–15.4)
WBC: 5.2 10*3/uL (ref 3.4–10.8)

## 2019-02-11 LAB — LIPASE: Lipase: 58 U/L (ref 13–78)

## 2019-02-12 ENCOUNTER — Encounter: Payer: Self-pay | Admitting: Radiology

## 2019-02-19 ENCOUNTER — Ambulatory Visit: Payer: Self-pay

## 2019-02-19 ENCOUNTER — Ambulatory Visit (INDEPENDENT_AMBULATORY_CARE_PROVIDER_SITE_OTHER): Payer: Commercial Managed Care - PPO | Admitting: Orthopaedic Surgery

## 2019-02-19 ENCOUNTER — Encounter: Payer: Self-pay | Admitting: Orthopaedic Surgery

## 2019-02-19 VITALS — Ht 68.0 in | Wt 138.0 lb

## 2019-02-19 DIAGNOSIS — S52501A Unspecified fracture of the lower end of right radius, initial encounter for closed fracture: Secondary | ICD-10-CM | POA: Diagnosis not present

## 2019-02-19 NOTE — Progress Notes (Signed)
Patient: Jacob Logan           Date of Birth: 07/20/1950           MRN: [id number] Visit Date: 02/19/2019              Requested by: [name], MD [address] [place],  [place] [id number] PCP: [name], MD   Assessment & Plan: Visit Diagnoses:  1. Closed fracture of distal end of right radius, unspecified fracture morphology, initial encounter     Plan: Impression is 3 weeks status post nondisplaced distal radius fracture, right wrist.  We will have the patient continue wearing his removable wrist splint.  I will start him in hand therapy for gentle range of motion.  Follow-up with [place] in 3 weeks time for three-view x-rays of the right wrist.  Call with concerns or questions in meantime.  Follow-Up Instructions: Return in about 3 weeks (around 03/12/2019).   Orders:  Orders Placed This Encounter  Procedures  . XR Wrist Complete Right  . Ambulatory referral to Physical Therapy   No orders of the defined types were placed in this encounter.     Procedures: No procedures performed   Clinical Data: No additional findings.   Subjective: Chief Complaint  Patient presents with  . Right Wrist - Follow-up    Nondisplaced distal radius fx     HPI patient is a pleasant 68 year old gentleman who presents our clinic today approximately 3 weeks status post nondisplaced distal radius fracture, right wrist, date of injury 01/27/2019.  He has been doing well.  He has been wearing a removable Velcro splint with minimal pain.  He does feel as though he is fairly stiff with the right wrist.  Review of Systems as detailed in HPI.  All others reviewed and are negative.   Objective: Vital Signs: Ht 5\' 8"  (1.727 m)   Wt 138 lb (62.6 kg)   BMI 20.98 kg/m   Physical Exam well-developed well-nourished gentleman in no acute distress.  Alert and oriented x3.  Ortho Exam examination of his right wrist reveals mild tenderness to the distal radius.  Limited range of  motion in all planes secondary to stiffness.  He is neurovascular intact distally.  Specialty Comments:  No specialty comments available.  Imaging: Xr Wrist Complete Right  Result Date: 02/19/2019 X-rays demonstrate slight bony consolidation of fracture site    PMFS History: Patient Active Problem List   Diagnosis Date Noted  . CKD (chronic kidney disease), stage III ([place]) 11/22/2014  . Renal stone 11/19/2014  . Malnutrition of moderate degree ([place]) 11/19/2014  . Scoliosis (and kyphoscoliosis), idiopathic 10/18/2014   Past Medical History:  Diagnosis Date  . Anemia   . Bilateral plantar fasciitis   . Hemorrhoids   . History of hydrocele   . History of kidney stones   . Malnourished ([place])   . 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 Wears glasses     Family History  Problem Relation Age of Onset  . Hypertension Mother   . Uterine cancer Mother   . Obesity Mother   . Hyperlipidemia Mother   . Heart disease Mother   . Diabetes Mother   . Cancer Mother   . Obesity Father   . Hypertension Father   . Diabetes Father   . Heart disease Father   . Hyperlipidemia Father   . Heart attack Father        First one in his 64s  .  Stroke Brother   . Obesity Brother   . Alzheimer's disease Brother   . Diabetes Brother   . Heart disease Brother   . Hyperlipidemia Brother   . Hypertension Brother   . Cataracts Maternal Grandmother     Past Surgical History:  Procedure Laterality Date  . COLONOSCOPY    . HYDROCELE EXCISION    . KIDNEY STONE SURGERY    . TONSILLECTOMY    . TRANSANAL HEMORRHOIDAL DEARTERIALIZATION N/A 05/02/2018   Procedure: TRANSANAL HEMORRHOIDAL DEARTERIALIZATION;  Surgeon: Leighton Ruff, MD;  Location: [facility];  Service: General;  Laterality: N/A;   Social History   Occupational History  . Not on file  Tobacco Use  . Smoking status: Never Smoker  . Smokeless tobacco: Never Used  Substance and Sexual Activity  . Alcohol  use: Yes    Alcohol/week: 1.0 standard drinks    Types: 1 Standard drinks or equivalent per week  . Drug use: No  . Sexual activity: Never

## 2019-03-12 ENCOUNTER — Encounter: Payer: Self-pay | Admitting: Orthopaedic Surgery

## 2019-03-12 ENCOUNTER — Ambulatory Visit (INDEPENDENT_AMBULATORY_CARE_PROVIDER_SITE_OTHER): Payer: Commercial Managed Care - PPO | Admitting: Orthopaedic Surgery

## 2019-03-12 ENCOUNTER — Ambulatory Visit: Payer: Self-pay

## 2019-03-12 DIAGNOSIS — S52501A Unspecified fracture of the lower end of right radius, initial encounter for closed fracture: Secondary | ICD-10-CM | POA: Diagnosis not present

## 2019-03-12 NOTE — Progress Notes (Signed)
Office Visit Note   Patient: Jacob Logan           Date of Birth: Oct 04, 1950           MRN: [id number] Visit Date: 03/12/2019              Requested by: [name], MD [address] [place],  [place] [address] PCP: [name], MD   Assessment & Plan: Visit Diagnoses:  1. Closed fracture of distal end of right radius, unspecified fracture morphology, initial encounter     Plan: Impression is healed right distal radius fracture.  The patient will start to advance with activity as tolerated.  He will follow-up with [place] as needed.  Call with concerns or questions the meantime.  Follow-Up Instructions: Return if symptoms worsen or fail to improve.   Orders:  Orders Placed This Encounter  Procedures  . XR Wrist Complete Right   No orders of the defined types were placed in this encounter.     Procedures: No procedures performed   Clinical Data: No additional findings.   Subjective: Chief Complaint  Patient presents with  . Right Wrist - Pain    HPI patient is a pleasant 68 year old gentleman who presents our clinic for 6-week status post nondisplaced distal radius fracture on the right.  He has been doing well.  He has been in hand therapy where he is regained near full range of motion.  He has been working on strengthening exercises.  No pain.  Review of Systems as detailed in HPI.  All others reviewed and are negative.   Objective: Vital Signs: There were no vitals taken for this visit.  Physical Exam well-developed and well-nourished gentleman in no acute distress.  Alert and oriented x3.  Ortho Exam examination of his right wrist reveals no swelling.  No bony tenderness.  Full range of motion in all planes.  He is neurovascular intact distally.  Specialty Comments:  No specialty comments available.  Imaging: Xr Wrist Complete Right  Result Date: 03/12/2019 X-rays demonstrate a healed distal radius fracture with evidence of callus formation    PMFS History: Patient Active Problem List   Diagnosis Date Noted  . CKD (chronic kidney disease), stage III ([place]) 11/22/2014  . Renal stone 11/19/2014  . Malnutrition of moderate degree ([place]) 11/19/2014  . Scoliosis (and kyphoscoliosis), idiopathic 10/18/2014   Past Medical History:  Diagnosis Date  . Anemia   . Bilateral plantar fasciitis   . Hemorrhoids   . History of hydrocele   . History of kidney stones   . Malnourished ([place])   . 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 Wears glasses     Family History  Problem Relation Age of Onset  . Hypertension Mother   . Uterine cancer Mother   . Obesity Mother   . Hyperlipidemia Mother   . Heart disease Mother   . Diabetes Mother   . Cancer Mother   . Obesity Father   . Hypertension Father   . Diabetes Father   . Heart disease Father   . Hyperlipidemia Father   . Heart attack Father        First one in his 3s  . Stroke Brother   . Obesity Brother   . Alzheimer's disease Brother   . Diabetes Brother   . Heart disease Brother   . Hyperlipidemia Brother   . Hypertension Brother   . Cataracts Maternal Grandmother     Past Surgical History:  Procedure  Laterality Date  . COLONOSCOPY    . HYDROCELE EXCISION    . KIDNEY STONE SURGERY    . TONSILLECTOMY    . TRANSANAL HEMORRHOIDAL DEARTERIALIZATION N/A 05/02/2018   Procedure: TRANSANAL HEMORRHOIDAL DEARTERIALIZATION;  Surgeon: Leighton Ruff, MD;  Location: [facility];  Service: General;  Laterality: N/A;   Social History   Occupational History  . Not on file  Tobacco Use  . Smoking status: Never Smoker  . Smokeless tobacco: Never Used  Substance and Sexual Activity  . Alcohol use: Yes    Alcohol/week: 1.0 standard drinks    Types: 1 Standard drinks or equivalent per week  . Drug use: No  . Sexual activity: Never

## 2019-05-13 ENCOUNTER — Other Ambulatory Visit: Payer: Self-pay | Admitting: Family Medicine

## 2019-05-13 DIAGNOSIS — R1013 Epigastric pain: Secondary | ICD-10-CM

## 2019-05-14 ENCOUNTER — Other Ambulatory Visit: Payer: Self-pay | Admitting: Family Medicine

## 2019-05-14 DIAGNOSIS — R1013 Epigastric pain: Secondary | ICD-10-CM

## 2019-06-16 ENCOUNTER — Other Ambulatory Visit: Payer: Self-pay

## 2019-06-16 ENCOUNTER — Telehealth (INDEPENDENT_AMBULATORY_CARE_PROVIDER_SITE_OTHER): Payer: Commercial Managed Care - PPO | Admitting: Registered Nurse

## 2019-06-16 ENCOUNTER — Ambulatory Visit: Payer: Commercial Managed Care - PPO | Attending: Internal Medicine

## 2019-06-16 DIAGNOSIS — R05 Cough: Secondary | ICD-10-CM | POA: Diagnosis not present

## 2019-06-16 DIAGNOSIS — R059 Cough, unspecified: Secondary | ICD-10-CM

## 2019-06-16 DIAGNOSIS — U071 COVID-19: Secondary | ICD-10-CM

## 2019-06-16 DIAGNOSIS — R0989 Other specified symptoms and signs involving the circulatory and respiratory systems: Secondary | ICD-10-CM

## 2019-06-16 DIAGNOSIS — R238 Other skin changes: Secondary | ICD-10-CM

## 2019-06-16 MED ORDER — DEXTROMETHORPHAN HBR 15 MG/5ML PO SYRP: 10.0000 mL | ORAL_SOLUTION | Freq: Four times a day (QID) | ORAL | 0 refills | Status: DC | PRN

## 2019-06-16 MED ORDER — PROMETHAZINE-CODEINE 6.25-10 MG/5ML PO SOLN: 5.0000 mL | Freq: Every evening | ORAL | 0 refills | Status: DC | PRN

## 2019-06-16 MED ORDER — BENZONATATE 100 MG PO CAPS: 100.0000 mg | ORAL_CAPSULE | Freq: Two times a day (BID) | ORAL | 0 refills | Status: DC | PRN

## 2019-06-16 MED ORDER — AZITHROMYCIN 250 MG PO TABS: ORAL_TABLET | ORAL | 0 refills | Status: DC

## 2019-06-16 MED ORDER — ALBUTEROL SULFATE HFA 108 (90 BASE) MCG/ACT IN AERS: 2.0000 | INHALATION_SPRAY | Freq: Four times a day (QID) | RESPIRATORY_TRACT | 2 refills | Status: AC | PRN

## 2019-06-16 NOTE — Progress Notes (Signed)
Patient have a Very bad cough with a lot of phlegm. Temperature was 100.0 on Thursday.  Last Friday and Saturday Pt had diarrhea . Patients taking mucinex tablets it was better on Sunday morning it got worse as the day went on.

## 2019-06-17 LAB — NOVEL CORONAVIRUS, NAA: SARS-CoV-2, NAA: NOT DETECTED

## 2019-06-17 NOTE — Progress Notes (Signed)
Telemedicine Encounter- SOAP NOTE Established Patient  This telephone encounter was conducted with the patient's (or proxy's) verbal consent via audio telecommunications: yes  Patient was instructed to have this encounter in a suitably private space; and to only have persons present to whom they give permission to participate. In addition, patient identity was confirmed by use of name plus two identifiers (DOB and address).  I discussed the limitations, risks, security and privacy concerns of performing an evaluation and management service by telephone and the availability of in person appointments. I also discussed with the patient that there may be a patient responsible charge related to this service. The patient expressed understanding and agreed to proceed.  I spent a total of 13 minutes talking with the patient or their proxy.  No chief complaint on file.   Subjective   Jacob Logan is a 69 y.o. established patient. Telephone visit today for ongoing cough  Onset around 1 week ago. Worsening overall, but had one day of relief on Sunday. Diarrhea on Friday and Saturday, since resolved. Low grade temp at 100. Coughing consistently. Feels congested, taken OTC decongestants with some relief.  No true shob, but having trouble catching his breath d/t coughing at times. No sick contacts or known COVID exposure.  HPI   Patient Active Problem List   Diagnosis Date Noted  . CKD (chronic kidney disease), stage III 11/22/2014  . Renal stone 11/19/2014  . Malnutrition of moderate degree ([place]) 11/19/2014  . Scoliosis (and kyphoscoliosis), idiopathic 10/18/2014    Past Medical History:  Diagnosis Date  . Anemia   . Bilateral plantar fasciitis   . Hemorrhoids   . History of hydrocele   . History of kidney stones   . Malnourished ([place])   . 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 Wears glasses     Current Outpatient Medications  Medication Sig Dispense Refill  . sildenafil  (VIAGRA) 100 MG tablet TAKE 1/2 TO 1 TABLET BY MOUTH DAILY AS NEEDED FOR ERECTILE DYSFUNCTION 5 tablet 3  . albuterol (VENTOLIN HFA) 108 (90 Base) MCG/ACT inhaler Inhale 2 puffs into the lungs every 6 (six) hours as needed for wheezing or shortness of breath. 18 g 2  . azithromycin (ZITHROMAX) 250 MG tablet Take 2 tabs on first day, then take 1 tab daily. Finish entire supply. 6 tablet 0  . benzonatate (TESSALON) 100 MG capsule Take 1 capsule (100 mg total) by mouth 2 (two) times daily as needed for cough. 20 capsule 0  . dextromethorphan 15 MG/5ML syrup Take 10 mLs (30 mg total) by mouth 4 (four) times daily as needed for cough. 120 mL 0  . omeprazole (PRILOSEC) 20 MG capsule TAKE 1 CAPSULE BY MOUTH EVERY DAY (Patient not taking: Reported on 06/16/2019) 30 capsule 1  . Promethazine-Codeine 6.25-10 MG/5ML SOLN Take 5 mLs by mouth at bedtime as needed. 280 mL 0   No current facility-administered medications for this visit.    No Known Allergies  Social History   Socioeconomic History  . Marital status: Single    Spouse name: Not on file  . Number of children: Not on file  . Years of education: Not on file  . Highest education level: Not on file  Occupational History  . Not on file  Tobacco Use  . Smoking status: Never Smoker  . Smokeless tobacco: Never Used  Substance and Sexual Activity  . Alcohol use: Yes    Alcohol/week: 1.0 standard drinks    Types: 1  Standard drinks or equivalent per week  . Drug use: No  . Sexual activity: Never  Other Topics Concern  . Not on file  Social History Narrative  . Not on file   Social Determinants of Health   Financial Resource Strain:   . Difficulty of Paying Living Expenses: Not on file  Food Insecurity:   . Worried About Charity fundraiser in the Last Year: Not on file  . Ran Out of Food in the Last Year: Not on file  Transportation Needs:   . Lack of Transportation (Medical): Not on file  . Lack of Transportation (Non-Medical): Not  on file  Physical Activity:   . Days of Exercise per Week: Not on file  . Minutes of Exercise per Session: Not on file  Stress:   . Feeling of Stress : Not on file  Social Connections:   . Frequency of Communication with Friends and Family: Not on file  . Frequency of Social Gatherings with Friends and Family: Not on file  . Attends Religious Services: Not on file  . Active Member of Clubs or Organizations: Not on file  . Attends Archivist Meetings: Not on file  . Marital Status: Not on file  Intimate Partner Violence:   . Fear of Current or Ex-Partner: Not on file  . Emotionally Abused: Not on file  . Physically Abused: Not on file  . Sexually Abused: Not on file    ROS Per hpi   Objective   Vitals as reported by the patient: There were no vitals filed for this visit.  Diagnoses and all orders for this visit:  Cough -     albuterol (VENTOLIN HFA) 108 (90 Base) MCG/ACT inhaler; Inhale 2 puffs into the lungs every 6 (six) hours as needed for wheezing or shortness of breath. -     azithromycin (ZITHROMAX) 250 MG tablet; Take 2 tabs on first day, then take 1 tab daily. Finish entire supply. -     benzonatate (TESSALON) 100 MG capsule; Take 1 capsule (100 mg total) by mouth 2 (two) times daily as needed for cough. -     dextromethorphan 15 MG/5ML syrup; Take 10 mLs (30 mg total) by mouth 4 (four) times daily as needed for cough. -     Promethazine-Codeine 6.25-10 MG/5ML SOLN; Take 5 mLs by mouth at bedtime as needed.  Chest congestion -     albuterol (VENTOLIN HFA) 108 (90 Base) MCG/ACT inhaler; Inhale 2 puffs into the lungs every 6 (six) hours as needed for wheezing or shortness of breath. -     azithromycin (ZITHROMAX) 250 MG tablet; Take 2 tabs on first day, then take 1 tab daily. Finish entire supply. -     benzonatate (TESSALON) 100 MG capsule; Take 1 capsule (100 mg total) by mouth 2 (two) times daily as needed for cough. -     dextromethorphan 15 MG/5ML syrup;  Take 10 mLs (30 mg total) by mouth 4 (four) times daily as needed for cough. -     Promethazine-Codeine 6.25-10 MG/5ML SOLN; Take 5 mLs by mouth at bedtime as needed.   PLAN  Discussed supportive care at this time - cough suppression seems to be key to making this patient's symptoms better. Will also suggest he continue decongestants - sparingly - to help loosen up congestion.  Will also give prometh-codeine syrup to help him sleep.  Will give z pack as this patient is higher risk for developing a bacterial infection in the  wake of chest congestion.  Albuterol inhaler given for shob.  Patient encouraged to call clinic with any questions, comments, or concerns.   I discussed the assessment and treatment plan with the patient. The patient was provided an opportunity to ask questions and all were answered. The patient agreed with the plan and demonstrated an understanding of the instructions.   The patient was advised to call back or seek an in-person evaluation if the symptoms worsen or if the condition fails to improve as anticipated.  I provided 13 minutes of non-face-to-face time during this encounter.  Maximiano Coss, NP  Primary Care at [facility]

## 2019-06-26 ENCOUNTER — Telehealth: Payer: Self-pay | Admitting: Family Medicine

## 2019-06-26 NOTE — Telephone Encounter (Signed)
Called pt LVM wanting to know if a virtual appt would be ok  FR

## 2019-06-30 ENCOUNTER — Telehealth (INDEPENDENT_AMBULATORY_CARE_PROVIDER_SITE_OTHER): Payer: Commercial Managed Care - PPO | Admitting: Family Medicine

## 2019-06-30 ENCOUNTER — Other Ambulatory Visit: Payer: Self-pay

## 2019-06-30 VITALS — BP 120/80 | Temp 97.9°F | Ht 68.0 in | Wt 134.0 lb

## 2019-06-30 DIAGNOSIS — R05 Cough: Secondary | ICD-10-CM | POA: Diagnosis not present

## 2019-06-30 DIAGNOSIS — Z20822 Contact with and (suspected) exposure to covid-19: Secondary | ICD-10-CM

## 2019-06-30 DIAGNOSIS — R059 Cough, unspecified: Secondary | ICD-10-CM

## 2019-06-30 DIAGNOSIS — R0989 Other specified symptoms and signs involving the circulatory and respiratory systems: Secondary | ICD-10-CM

## 2019-06-30 MED ORDER — BENZONATATE 100 MG PO CAPS: 100.0000 mg | ORAL_CAPSULE | Freq: Three times a day (TID) | ORAL | 0 refills | Status: DC | PRN

## 2019-06-30 MED ORDER — DEXTROMETHORPHAN HBR 15 MG/5ML PO SYRP: 10.0000 mL | ORAL_SOLUTION | Freq: Four times a day (QID) | ORAL | 0 refills | Status: DC | PRN

## 2019-06-30 NOTE — Patient Instructions (Signed)
Continue Tessalon Perles, dextromethorphan as needed for cough.  I will have you evaluated through the respiratory clinic in the next 2 days.  If any acute worsening symptoms including any shortness of breath, confusion, inability to drink fluids, or other acute worsening, be seen in the urgent care or emergency room.

## 2019-06-30 NOTE — Progress Notes (Signed)
Virtual Visit via Telephone Note  I connected with Jacob Logan on 06/30/19 at 5:13 PM by telephone and verified that I am speaking with the correct person using two identifiers.    I discussed the limitations, risks, security and privacy concerns of performing an evaluation and management service by telephone and the availability of in person appointments. I also discussed with the patient that there may be a patient responsible charge related to this service. The patient expressed understanding and agreed to proceed, consent obtained  Chief complaint:  Chief Complaint  Patient presents with  . Cough    pt has had this cough for the past 3 weeks. cough started of productive, but is curently dry. mucus was green. pt has a runny nose no congestion. pt had a temp of 100 at the beginging of the month. pt aslso had dieriha at the beinging on the month.pt had a covid test on the 4th of january it was negitive this test was a week after symptoms stared.    History of Present Illness: Jacob Logan is a 69 y.o. male  Cough: Episodic at end of December, started around 12/28.  Worsened 06/12/19. Temp 100. Also had diarrhea for a few days. Had to leave work. No loss of taste/smell. Some fatigue, no bodyache.  Some rhinorrhea.  Out of work 5 days.  Had Covid test 06/16/19, told was negative few days later.  telemed visit 1/4. Prescribed zpak, tessalon perles, dextromethorphan 30mg  QID, phenergan with codeine cough syrup, albuterol inhaler.  Not sure if needs inhaler  - used few times per day, but not wheezing/dyspneic.  No return of fever.  No shortness of breath.  Persistent cough. Initial green mucus, first week of January, then improved.  Cough started to worsen past 3 days. Nonproductive. More cough with talking. More frequent. No near syncope/confusion/lightheadedness.  No heartburn.  Ran out of tessalon perles yesterday.      Patient Active Problem List   Diagnosis Date Noted  . CKD  (chronic kidney disease), stage III 11/22/2014  . Renal stone 11/19/2014  . Malnutrition of moderate degree ([place]) 11/19/2014  . Scoliosis (and kyphoscoliosis), idiopathic 10/18/2014   Past Medical History:  Diagnosis Date  . Anemia   . Bilateral plantar fasciitis   . Hemorrhoids   . History of hydrocele   . History of kidney stones   . Malnourished ([place])   . 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 Wears glasses    Past Surgical History:  Procedure Laterality Date  . COLONOSCOPY    . HYDROCELE EXCISION    . KIDNEY STONE SURGERY    . TONSILLECTOMY    . TRANSANAL HEMORRHOIDAL DEARTERIALIZATION N/A 05/02/2018   Procedure: TRANSANAL HEMORRHOIDAL DEARTERIALIZATION;  Surgeon: Leighton Ruff, MD;  Location: [facility];  Service: General;  Laterality: N/A;   No Known Allergies Prior to Admission medications   Medication Sig Start Date End Date Taking? Authorizing Provider  albuterol (VENTOLIN HFA) 108 (90 Base) MCG/ACT inhaler Inhale 2 puffs into the lungs every 6 (six) hours as needed for wheezing or shortness of breath. 06/16/19  Yes Maximiano Coss, NP  mirabegron ER (MYRBETRIQ) 25 MG TB24 tablet Take 25 mg by mouth daily.   Yes [provider]  sildenafil (VIAGRA) 100 MG tablet TAKE 1/2 TO 1 TABLET BY MOUTH DAILY AS NEEDED FOR ERECTILE DYSFUNCTION 09/25/18  Yes [name], MD  azithromycin (ZITHROMAX) 250 MG tablet Take 2 tabs on first day, then take 1 tab  daily. Finish entire supply. Patient not taking: Reported on 06/30/2019 06/16/19   Maximiano Coss, NP  benzonatate (TESSALON) 100 MG capsule Take 1 capsule (100 mg total) by mouth 2 (two) times daily as needed for cough. Patient not taking: Reported on 06/30/2019 06/16/19   Maximiano Coss, NP  dextromethorphan 15 MG/5ML syrup Take 10 mLs (30 mg total) by mouth 4 (four) times daily as needed for cough. Patient not taking: Reported on 06/30/2019 06/16/19   Maximiano Coss, NP  omeprazole (PRILOSEC) 20 MG  capsule TAKE 1 CAPSULE BY MOUTH EVERY DAY Patient not taking: Reported on 06/16/2019 05/14/19   [name], MD  Promethazine-Codeine 6.25-10 MG/5ML SOLN Take 5 mLs by mouth at bedtime as needed. Patient not taking: Reported on 06/30/2019 06/16/19   Maximiano Coss, NP   Social History   Socioeconomic History  . Marital status: Single    Spouse name: Not on file  . Number of children: Not on file  . Years of education: Not on file  . Highest education level: Not on file  Occupational History  . Not on file  Tobacco Use  . Smoking status: Never Smoker  . Smokeless tobacco: Never Used  Substance and Sexual Activity  . Alcohol use: Yes    Alcohol/week: 1.0 standard drinks    Types: 1 Standard drinks or equivalent per week  . Drug use: No  . Sexual activity: Never  Other Topics Concern  . Not on file  Social History Narrative  . Not on file   Social Determinants of Health   Financial Resource Strain:   . Difficulty of Paying Living Expenses: Not on file  Food Insecurity:   . Worried About Charity fundraiser in the Last Year: Not on file  . Ran Out of Food in the Last Year: Not on file  Transportation Needs:   . Lack of Transportation (Medical): Not on file  . Lack of Transportation (Non-Medical): Not on file  Physical Activity:   . Days of Exercise per Week: Not on file  . Minutes of Exercise per Session: Not on file  Stress:   . Feeling of Stress : Not on file  Social Connections:   . Frequency of Communication with Friends and Family: Not on file  . Frequency of Social Gatherings with Friends and Family: Not on file  . Attends Religious Services: Not on file  . Active Member of Clubs or Organizations: Not on file  . Attends Archivist Meetings: Not on file  . Marital Status: Not on file  Intimate Partner Violence:   . Fear of Current or Ex-Partner: Not on file  . Emotionally Abused: Not on file  . Physically Abused: Not on file  . Sexually Abused: Not  on file     Observations/Objective: Vitals:   06/30/19 1543  BP: 120/80  Temp: 97.9 F (36.6 C)  Weight: 134 lb (60.8 kg)  Height: 5\' 8"  (1.727 m)  home vitals.   Few coughs during visit. Speaking in full sentences without distress.  Appropriate responses.  All questions answered.   Assessment and Plan: Encounter by telehealth for suspected COVID-19  Cough - Plan: benzonatate (TESSALON) 100 MG capsule, dextromethorphan 15 MG/5ML syrup  Chest congestion - Plan: benzonatate (TESSALON) 100 MG capsule, dextromethorphan 15 MG/5ML syrup  Minimal cough approximately 1228, but primary symptoms started 06/12/2019.  Fever, cough, diarrhea.  Suspicious for COVID-19.  Negative Covid test 1/4, approximately 4 days after start of significant symptoms.  Had some improvement  of cough with treatment from 1 4 including azithromycin.  Recurrence of cough past few days.  Denies fever/dyspnea/confusion/dehydration.  -Differential still includes possible COVID-19 with false negative test initially, other viral illness including influenza also possible.  Worsening cough concerning for possible lower respiratory tract infection/pneumonia but denies fever/dyspnea.  We will try to arrange evaluation through respiratory clinic today or at the latest Wednesday, refilled Tessalon Perles and dextromethorphan, out of work at this time until evaluation, ER precautions given with understanding expressed.  Follow Up Instructions: Respiratory clinic today or Wednesday afternoon.  ER precautions given.   I discussed the assessment and treatment plan with the patient. The patient was provided an opportunity to ask questions and all were answered. The patient agreed with the plan and demonstrated an understanding of the instructions.   The patient was advised to call back or seek an in-person evaluation if the symptoms worsen or if the condition fails to improve as anticipated.  I provided 17 minutes of non-face-to-face  time during this encounter.  Signed,   [name], MD Primary Care at [place].  06/30/19

## 2019-07-02 ENCOUNTER — Encounter: Payer: Self-pay | Admitting: Family Medicine

## 2019-07-02 ENCOUNTER — Ambulatory Visit (INDEPENDENT_AMBULATORY_CARE_PROVIDER_SITE_OTHER): Payer: Commercial Managed Care - PPO | Admitting: Family Medicine

## 2019-07-02 ENCOUNTER — Ambulatory Visit: Payer: Commercial Managed Care - PPO | Attending: Internal Medicine

## 2019-07-02 VITALS — BP 110/70 | HR 96 | Temp 97.8°F | Ht 69.0 in | Wt 137.0 lb

## 2019-07-02 DIAGNOSIS — Z20822 Contact with and (suspected) exposure to covid-19: Secondary | ICD-10-CM | POA: Diagnosis not present

## 2019-07-02 DIAGNOSIS — R05 Cough: Secondary | ICD-10-CM | POA: Diagnosis not present

## 2019-07-02 DIAGNOSIS — E441 Mild protein-calorie malnutrition: Secondary | ICD-10-CM | POA: Diagnosis not present

## 2019-07-02 DIAGNOSIS — R053 Chronic cough: Secondary | ICD-10-CM

## 2019-07-02 MED ORDER — PREDNISONE 20 MG PO TABS: 40.0000 mg | ORAL_TABLET | Freq: Every day | ORAL | 0 refills | Status: DC

## 2019-07-02 NOTE — Progress Notes (Signed)
Patient ID: Jacob Logan, male    DOB: 11/18/1950, 69 y.o.   MRN: [id number]  PCP: [name], MD  Chief Complaint  Patient presents with  . Cough    Subjective:  HPI Jacob Logan is a 69 y.o. male presents for evaluation at [facility] for evaluation of on-going cough. Patient tested negative for COVID-19 on 06/16/19. He is unaware of any direct exposure to COVID-19. His cough has gradually worsened over the last few weeks. His PCP prescribed Azithromycin , albuterol, and benzonatate and reports no improvement with treatment. History of bronchitis, recurrent int he past. He is a non-smoker although is exposed to passive second-hand smoke.  High Risk for complications related to COVID-19 due to age >45 years old and CKD III Social History   Socioeconomic History  . Marital status: Single    Spouse name: Not on file  . Number of children: Not on file  . Years of education: Not on file  . Highest education level: Not on file  Occupational History  . Not on file  Tobacco Use  . Smoking status: Never Smoker  . Smokeless tobacco: Never Used  Substance and Sexual Activity  . Alcohol use: Yes    Alcohol/week: 1.0 standard drinks    Types: 1 Standard drinks or equivalent per week  . Drug use: No  . Sexual activity: Never  Other Topics Concern  . Not on file  Social History Narrative  . Not on file   Social Determinants of Health   Financial Resource Strain:   . Difficulty of Paying Living Expenses: Not on file  Food Insecurity:   . Worried About Charity fundraiser in the Last Year: Not on file  . Ran Out of Food in the Last Year: Not on file  Transportation Needs:   . Lack of Transportation (Medical): Not on file  . Lack of Transportation (Non-Medical): Not on file  Physical Activity:   . Days of Exercise per Week: Not on file  . Minutes of Exercise per Session: Not on file  Stress:   . Feeling of Stress : Not on file  Social Connections:   . Frequency  of Communication with Friends and Family: Not on file  . Frequency of Social Gatherings with Friends and Family: Not on file  . Attends Religious Services: Not on file  . Active Member of Clubs or Organizations: Not on file  . Attends Archivist Meetings: Not on file  . Marital Status: Not on file  Intimate Partner Violence:   . Fear of Current or Ex-Partner: Not on file  . Emotionally Abused: Not on file  . Physically Abused: Not on file  . Sexually Abused: Not on file    Family History  Problem Relation Age of Onset  . Hypertension Mother   . Uterine cancer Mother   . Obesity Mother   . Hyperlipidemia Mother   . Heart disease Mother   . Diabetes Mother   . Cancer Mother   . Obesity Father   . Hypertension Father   . Diabetes Father   . Heart disease Father   . Hyperlipidemia Father   . Heart attack Father        First one in his 16s  . Stroke Brother   . Obesity Brother   . Alzheimer's disease Brother   . Diabetes Brother   . Heart disease Brother   . Hyperlipidemia Brother   . Hypertension Brother   . Cataracts Maternal  Grandmother     Review of Systems  Pertinent negatives listed in HPI  Patient Active Problem List   Diagnosis Date Noted  . CKD (chronic kidney disease), stage III 11/22/2014  . Renal stone 11/19/2014  . Malnutrition of moderate degree ([place]) 11/19/2014  . Scoliosis (and kyphoscoliosis), idiopathic 10/18/2014    No Known Allergies  Prior to Admission medications   Medication Sig Start Date End Date Taking? Authorizing Provider  albuterol (VENTOLIN HFA) 108 (90 Base) MCG/ACT inhaler Inhale 2 puffs into the lungs every 6 (six) hours as needed for wheezing or shortness of breath. 06/16/19  Yes Maximiano Coss, NP  benzonatate (TESSALON) 100 MG capsule Take 1 capsule (100 mg total) by mouth 3 (three) times daily as needed for cough. 06/30/19  Yes [name], MD  [facility] COUGH DM 30 MG/5ML liquid Take 5 mLs by mouth 4 (four) times daily  as needed. 06/16/19  Yes [provider]  dextromethorphan 15 MG/5ML syrup Take 10 mLs (30 mg total) by mouth 4 (four) times daily as needed for cough. 06/30/19  Yes [name], MD  mirabegron ER (MYRBETRIQ) 25 MG TB24 tablet Take 25 mg by mouth daily.   Yes [provider]  sildenafil (VIAGRA) 100 MG tablet TAKE 1/2 TO 1 TABLET BY MOUTH DAILY AS NEEDED FOR ERECTILE DYSFUNCTION 09/25/18  Yes [name], MD    Past Medical, Surgical Family and Social History reviewed and updated.    Objective:   Today's Vitals   07/02/19 1754  BP: 110/70  Pulse: 96  Temp: 97.8 F (36.6 C)  SpO2: 94%  Weight: 137 lb (62.1 kg)  Height: 5\' 9"  (1.753 m)    Wt Readings from Last 3 Encounters:  07/02/19 137 lb (62.1 kg)  06/30/19 134 lb (60.8 kg)  02/19/19 138 lb (62.6 kg)     Physical Exam Constitutional:      Appearance: He is underweight. He is ill-appearing.  Cardiovascular:     Rate and Rhythm: Normal rate and regular rhythm.  Pulmonary:     Effort: Pulmonary effort is normal.     Breath sounds: Decreased air movement and transmitted upper airway sounds present. Examination of the right-upper field reveals rhonchi. Examination of the left-upper field reveals rhonchi. Rhonchi present. No wheezing.     Comments: Persistent non-productive cough(noted during exam) Musculoskeletal:     Cervical back: Normal range of motion.  Lymphadenopathy:     Cervical: No cervical adenopathy.  Skin:    General: Skin is warm.  Neurological:     General: No focal deficit present.  Psychiatric:        Mood and Affect: Mood normal.        Behavior: Behavior is cooperative.     Lab Results  Component Value Date   POCGLU 101 (A) 03/29/2015       Assessment & Plan:  1. Encounter for screening laboratory testing for COVID-19 virus -Repeated COVID-19 testing today -Continue masking, hand hygiene, social distancing 2. Persistent cough -Continue Benzonatate. Recently  completed Azithromycin. -Start prednisone 40 mg once daily x 5 day to decrease bronchial constriction which is likely exacerbating cough -Chest x-ray pending   3. Mild protein malnutrition (HCC) -Encouraged hydration. -Prednisone will likely improve appetite   Meds ordered this encounter  Medications  . predniSONE (DELTASONE) 20 MG tablet    Sig: Take 2 tablets (40 mg total) by mouth daily with breakfast.    Dispense:  10 tablet    Refill:  0     -  The patient was given clear instructions to go to ER or return to medical center if symptoms do not improve, worsen or new problems develop. The patient verbalized understanding.   Molli Barrows, FNP-C [facility], PRN Provider  [facility]. [place], [facility] Phone: [phone] Clinic Fax: [phone] Clinic Hours: 5:30 pm -7:30 pm (Monday, Wednesday, and Friday)

## 2019-07-02 NOTE — Patient Instructions (Signed)
For x-ray imaging:  Hours: 8:00 am-4:30 pm [facility]  [address], [place], [place] [address] Enter through Main Entrance and request x-ray department. You will be contacted either via phone or via [place] with your x-ray result.  Start prednisone 40 mg once daily x 5 days. 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 Negative results are immediately resulted to Mychart. All positive results are communicated with a phone call from our office.        COVID-19 Frequently Asked Questions COVID-19 (coronavirus disease) is an infection that is caused by a large family of viruses. Some viruses cause illness in people and others cause illness in animals like camels, cats, and bats. In some cases, the viruses that cause illness in animals can spread to humans. Where did the coronavirus come from? In December 2019, [place] told the [facility]) of several cases of lung disease (human respiratory illness). These cases were linked to an open seafood and livestock market in the city of [place]. The link to the seafood and livestock market suggests that the virus may have spread from animals to humans. However, since that first outbreak in December, the virus has also been shown to spread from person to person. What is the name of the disease and the virus? Disease name Early on, this disease was called novel coronavirus. This is because scientists determined that the disease was caused by a new (novel) respiratory virus. The World Health Organization [facility]) has now named the disease COVID-19, or coronavirus disease. Virus name The virus that causes the disease is called severe acute respiratory syndrome coronavirus 2 (SARS-CoV-2). More information on disease and virus naming World Health Organization [facility]):  www.who.int/emergencies/diseases/novel-coronavirus-2019/technical-guidance/naming-the-coronavirus-disease-(covid-2019)-and-the-virus-that-causes-it Who is at risk for complications from coronavirus disease? Some people may be at higher risk for complications from coronavirus disease. This includes older adults and people who have chronic diseases, such as heart disease, diabetes, and lung disease. If you are at higher risk for complications, take these extra precautions:  Stay home as much as possible.  Avoid social gatherings and travel.  Avoid close contact with others. Stay at least 6 ft (2 m) away from others, if possible.  Wash your hands often with soap and water for at least 20 seconds.  Avoid touching your face, mouth, nose, or eyes.  Keep supplies on hand at home, such as food, medicine, and cleaning supplies.  If you must go out in public, wear a cloth face covering or face mask. Make sure your mask covers your nose and mouth. How does coronavirus disease spread? The virus that causes coronavirus disease spreads easily from person to person (is contagious). You may catch the virus by:  Breathing in droplets from an infected person. Droplets can be spread by a person breathing, speaking, singing, coughing, or sneezing.  Touching something, like a table or a doorknob, that was exposed to the virus (contaminated) and then touching your mouth, nose, or eyes. Can I get the virus from touching surfaces or objects? There is still a lot that we do not know about the virus that causes coronavirus disease. Scientists are basing a lot of information on what they know about similar viruses, such as:  Viruses cannot generally survive on surfaces for long. They need a human body (host) to survive.  It is more likely that the virus is spread by close contact with people who  are sick (direct contact), such as through: ? Shaking hands or hugging. ? Breathing in respiratory droplets that  travel through the air. Droplets can be spread by a person breathing, speaking, singing, coughing, or sneezing.  It is less likely that the virus is spread when a person touches a surface or object that has the virus on it (indirect contact). The virus may be able to enter the body if the person touches a surface or object and then touches his or her face, eyes, nose, or mouth. Can a person spread the virus without having symptoms of the disease? It may be possible for the virus to spread before a person has symptoms of the disease, but this is most likely not the main way the virus is spreading. It is more likely for the virus to spread by being in close contact with people who are sick and breathing in the respiratory droplets spread by a person breathing, speaking, singing, coughing, or sneezing. What are the symptoms of coronavirus disease? Symptoms vary from person to person and can range from mild to severe. Symptoms may include:  Fever or chills.  Cough.  Difficulty breathing or feeling short of breath.  Headaches, body aches, or muscle aches.  Runny or stuffy (congested) nose.  Sore throat.  New loss of taste or smell.  Nausea, vomiting, or diarrhea. These symptoms can appear anywhere from 2 to 14 days after you have been exposed to the virus. Some people may not have any symptoms. If you develop symptoms, call your health care provider. People with severe symptoms may need hospital care. Should I be tested for this virus? Your health care provider will decide whether to test you based on your symptoms, history of exposure, and your risk factors. How does a health care provider test for this virus? Health care providers will collect samples to send for testing. Samples may include:  Taking a swab of fluid from the back of your nose and throat, your nose, or your throat.  Taking fluid from the lungs by having you cough up mucus (sputum) into a sterile cup.  Taking a blood  sample. Is there a treatment or vaccine for this virus? Currently, there is no vaccine to prevent coronavirus disease. Also, there are no medicines like antibiotics or antivirals to treat the virus. A person who becomes sick is given supportive care, which means rest and fluids. A person may also relieve his or her symptoms by using over-the-counter medicines that treat sneezing, coughing, and runny nose. These are the same medicines that a person takes for the common cold. If you develop symptoms, call your health care provider. People with severe symptoms may need hospital care. What can I do to protect myself and my family from this virus?     You can protect yourself and your family by taking the same actions that you would take to prevent the spread of other viruses. Take the following actions:  Wash your hands often with soap and water for at least 20 seconds. If soap and water are not available, use alcohol-based hand sanitizer.  Avoid touching your face, mouth, nose, or eyes.  Cough or sneeze into a tissue, sleeve, or elbow. Do not cough or sneeze into your hand or the air. ? If you cough or sneeze into a tissue, throw it away immediately and wash your hands.  Disinfect objects and surfaces that you frequently touch every day.  Stay away from people who are sick.  Avoid going  out in public, follow guidance from your state and local health authorities.  Avoid crowded indoor spaces. Stay at least 6 ft (2 m) away from others.  If you must go out in public, wear a cloth face covering or face mask. Make sure your mask covers your nose and mouth.  Stay home if you are sick, except to get medical care. Call your health care provider before you get medical care. Your health care provider will tell you how long to stay home.  Make sure your vaccines are up to date. Ask your health care provider what vaccines you need. What should I do if I need to travel? Follow travel recommendations  from your local health authority, the CDC, and WHO. Travel information and advice  Centers for Disease Control and Prevention (CDC): BodyEditor.hu  World Health Organization [facility]): ThirdIncome.ca Know the risks and take action to protect your health  You are at higher risk of getting coronavirus disease if you are traveling to areas with an outbreak or if you are exposed to travelers from areas with an outbreak.  Wash your hands often and practice good hygiene to lower the risk of catching or spreading the virus. What should I do if I am sick? General instructions to stop the spread of infection  Wash your hands often with soap and water for at least 20 seconds. If soap and water are not available, use alcohol-based hand sanitizer.  Cough or sneeze into a tissue, sleeve, or elbow. Do not cough or sneeze into your hand or the air.  If you cough or sneeze into a tissue, throw it away immediately and wash your hands.  Stay home unless you must get medical care. Call your health care provider or local health authority before you get medical care.  Avoid public areas. Do not take public transportation, if possible.  If you can, wear a mask if you must go out of the house or if you are in close contact with someone who is not sick. Make sure your mask covers your nose and mouth. Keep your home clean  Disinfect objects and surfaces that are frequently touched every day. This may include: ? Counters and tables. ? Doorknobs and light switches. ? Sinks and faucets. ? Electronics such as phones, remote controls, keyboards, computers, and tablets.  Wash dishes in hot, soapy water or use a dishwasher. Air-dry your dishes.  Wash laundry in hot water. Prevent infecting other household members  Let healthy household members care for children and pets, if possible. If you have to care for children or  pets, wash your hands often and wear a mask.  Sleep in a different bedroom or bed, if possible.  Do not share personal items, such as razors, toothbrushes, deodorant, combs, brushes, towels, and washcloths. Where to find more information Centers for Disease Control and Prevention (CDC)  Information and news updates: https://www.butler-gonzalez.com/ World Health Organization [facility]  Information and news updates: MissExecutive.com.ee  Coronavirus health topic: https://www.castaneda.info/  Questions and answers on COVID-19: OpportunityDebt.at  Global tracker: who.sprinklr.com American Academy of Pediatrics (AAP)  Information for families: www.healthychildren.org/English/health-issues/conditions/chest-lungs/Pages/2019-Novel-Coronavirus.aspx The coronavirus situation is changing rapidly. Check your local health authority website or the CDC and [facility] websites for updates and news. When should I contact a health care provider?  Contact your health care provider if you have symptoms of an infection, such as fever or cough, and you: ? Have been near anyone who is known to have coronavirus disease. ? Have come into contact with  a person who is suspected to have coronavirus disease. ? Have traveled to an area where there is an outbreak of COVID-19. When should I get emergency medical care?  Get help right away by calling your local emergency services (911 in the [place] if you have: ? Trouble breathing. ? Pain or pressure in your chest. ? Confusion. ? Blue-tinged lips and fingernails. ? Difficulty waking from sleep. ? Symptoms that get worse. Let the emergency medical personnel know if you think you have coronavirus disease. Summary  A new respiratory virus is spreading from person to person and causing COVID-19 (coronavirus disease).  The virus that causes COVID-19 appears to spread easily. It spreads from one  person to another through droplets from breathing, speaking, singing, coughing, or sneezing.  Older adults and those with chronic diseases are at higher risk of disease. If you are at higher risk for complications, take extra precautions.  There is currently no vaccine to prevent coronavirus disease. There are no medicines, such as antibiotics or antivirals, to treat the virus.  You can protect yourself and your family by washing your hands often, avoiding touching your face, and covering your coughs and sneezes. This information is not intended to replace advice given to you by your health care provider. Make sure you discuss any questions you have with your health care provider. Document Revised: 03/28/2019 Document Reviewed: 09/24/2018 Elsevier Patient Education  [place].

## 2019-07-03 ENCOUNTER — Other Ambulatory Visit: Payer: Self-pay

## 2019-07-03 ENCOUNTER — Ambulatory Visit (HOSPITAL_COMMUNITY)
Admission: RE | Admit: 2019-07-03 | Discharge: 2019-07-03 | Disposition: A | Discharge status: Home / Self Care | Payer: Commercial Managed Care - PPO | Source: Ambulatory Visit | Attending: Family Medicine | Admitting: Family Medicine

## 2019-07-03 DIAGNOSIS — R05 Cough: Secondary | ICD-10-CM | POA: Diagnosis present

## 2019-07-03 DIAGNOSIS — R053 Chronic cough: Secondary | ICD-10-CM

## 2019-07-03 LAB — NOVEL CORONAVIRUS, NAA: SARS-CoV-2, NAA: NOT DETECTED

## 2019-07-04 ENCOUNTER — Telehealth: Payer: Self-pay | Admitting: Family Medicine

## 2019-07-04 ENCOUNTER — Other Ambulatory Visit: Payer: Self-pay | Admitting: Family Medicine

## 2019-07-04 MED ORDER — CEFDINIR 300 MG PO CAPS: 600.0000 mg | ORAL_CAPSULE | Freq: Every day | ORAL | 0 refills | Status: AC

## 2019-07-04 NOTE — Telephone Encounter (Signed)
Please contact Mr. Grall to advise his x-ray is concerning for possible pneumonia involving right lower lobe. Adding Cefdinir 600 mg once daily x 10 days. Follow-up me [facility] at [facility] 07/16/19. Please schedule appointment.

## 2019-07-04 NOTE — Progress Notes (Signed)
Cefdinir order only

## 2019-07-07 NOTE — Telephone Encounter (Signed)
Pt has been made aware of his chest xray results and to start Cefdinir 300 mg taking 2 tablets daily for 10 days. Pt has been scheduled to f/u with Respiratory clinic 07/14/2019.

## 2019-07-07 NOTE — Telephone Encounter (Signed)
Pt has been made aware of his chest

## 2019-07-14 ENCOUNTER — Other Ambulatory Visit: Payer: Self-pay

## 2019-07-14 ENCOUNTER — Ambulatory Visit (INDEPENDENT_AMBULATORY_CARE_PROVIDER_SITE_OTHER): Payer: Commercial Managed Care - PPO | Admitting: Family Medicine

## 2019-07-14 VITALS — BP 120/80 | HR 75 | Temp 98.6°F | Ht 69.0 in | Wt 134.0 lb

## 2019-07-14 DIAGNOSIS — Z8701 Personal history of pneumonia (recurrent): Secondary | ICD-10-CM | POA: Diagnosis not present

## 2019-07-14 DIAGNOSIS — R053 Chronic cough: Secondary | ICD-10-CM

## 2019-07-14 DIAGNOSIS — R05 Cough: Secondary | ICD-10-CM

## 2019-07-14 MED ORDER — FAMOTIDINE 40 MG PO TABS: 40.0000 mg | ORAL_TABLET | Freq: Two times a day (BID) | ORAL | 0 refills | Status: AC

## 2019-07-14 MED ORDER — HYDROCOD POLST-CPM POLST ER 10-8 MG/5ML PO SUER: 5.0000 mL | Freq: Two times a day (BID) | ORAL | 0 refills | Status: AC | PRN

## 2019-07-14 MED ORDER — PREDNISONE 20 MG PO TABS: 20.0000 mg | ORAL_TABLET | Freq: Every day | ORAL | 0 refills | Status: AC

## 2019-07-14 NOTE — Progress Notes (Signed)
Patient ID: Jacob Logan, male    DOB: 06-Mar-1951, 69 y.o.   MRN: [id number]  PCP: [name], MD  Chief Complaint  Patient presents with  . Cough    Subjective:  HPI  Jacob Logan is a 69 y.o. male presents to [facility] Respiratory clinic for follow-up evaluation of pneumonia unrelated to COVID-19. Patient last seen here in the respiratory clinic on 07/03/19. Repeat COVID-19 negative. Chest x-ray suspicious for right lobe pneumonia. History of bronchitis, recurrent in the past. He is a non-smoker although is exposed to passive second-hand smoke.  He was treated with a course of Omnicef. Symptoms today have mildly improved, however the cough remains persistent. Endorses what feels like a constant tick in his throat. He has taken anti-tussive cough syrup and benzonatate without significant improvement of cough.  He denies any prior history of acid reflux.  He is not experiencing any epigastric burning or any burning in his throat.  He has been unable to complete the entire sentence without experiencing multiple cycles of cough.  He has used inhaler with mild benefit although is only using approximately twice a day.  He has not had fever, chills, body aches or weakness. Shortness of breath has improved per patient with prescribed treatment.  Review of Systems Pertinent negatives listed in HPI   Patient Active Problem List   Diagnosis Date Noted  . CKD (chronic kidney disease), stage III 11/22/2014  . Renal stone 11/19/2014  . Malnutrition of moderate degree ([place]) 11/19/2014  . Scoliosis (and kyphoscoliosis), idiopathic 10/18/2014      Prior to Admission medications   Medication Sig Start Date End Date Taking? Authorizing Provider  albuterol (VENTOLIN HFA) 108 (90 Base) MCG/ACT inhaler Inhale 2 puffs into the lungs every 6 (six) hours as needed for wheezing or shortness of breath. 06/16/19   Maximiano Coss, NP  benzonatate (TESSALON) 100 MG capsule Take 1 capsule (100 mg total) by mouth 3  (three) times daily as needed for cough. 06/30/19   [name], MD  cefdinir (OMNICEF) 300 MG capsule Take 2 capsules (600 mg total) by mouth daily. 07/04/19   Scot Jun, FNP  [facility] COUGH DM 30 MG/5ML liquid Take 5 mLs by mouth 4 (four) times daily as needed. 06/16/19   [provider]  dextromethorphan 15 MG/5ML syrup Take 10 mLs (30 mg total) by mouth 4 (four) times daily as needed for cough. 06/30/19   [name], MD  mirabegron ER (MYRBETRIQ) 25 MG TB24 tablet Take 25 mg by mouth daily.    [provider]  predniSONE (DELTASONE) 20 MG tablet Take 2 tablets (40 mg total) by mouth daily with breakfast. 07/02/19   Scot Jun, FNP  sildenafil (VIAGRA) 100 MG tablet TAKE 1/2 TO 1 TABLET BY MOUTH DAILY AS NEEDED FOR ERECTILE DYSFUNCTION 09/25/18   [name], MD    Past Medical, Surgical Family and Social History reviewed and updated.    Objective:  There were no vitals filed for this visit.  Wt Readings from Last 3 Encounters:  07/02/19 137 lb (62.1 kg)  06/30/19 134 lb (60.8 kg)  02/19/19 138 lb (62.6 kg)     Physical Exam Constitutional:      Appearance: He is underweight. He is ill-appearing.  Cardiovascular:     Rate and Rhythm: Normal rate and regular rhythm.  Pulmonary:     Effort: Pulmonary effort is normal.     Breath sounds:Negative Rhonchi, Wheezing, or Rales. Positive dry cough (non-productive)  Musculoskeletal:     Cervical back: Normal range of motion.  Lymphadenopathy:     Cervical: No cervical adenopathy.  Skin:    General: Skin is warm.  Neurological:     General: No focal deficit present.  Psychiatric:        Mood and Affect: Mood normal.        Behavior: Behavior is cooperative.     Assessment & Plan:  1. Persistent cough for 3 weeks or longer 2. History of recent pneumonia -Trial another burst of prednisone 20 mg x 5 days -Add Tussionex -Trial Famotidine 40 mg BID to rule out the presence of a esophagitis   Place a referral for patient be evaluated in pulmonology as patient at this point has had a cough for nearly 6 weeks.  We discussed trialing the after mentioned regimen to see if we can gain better control of cough however if no improvement patient will obtain an appointment with pulmonology for further work-up to rule out any other etiologies of cough and recent pneumonia.  Patient tested negative for COVID-19. - Ambulatory referral to Pulmonology   Meds ordered this encounter  Medications  . predniSONE (DELTASONE) 20 MG tablet    Sig: Take 1 tablet (20 mg total) by mouth daily with breakfast for 5 days.    Dispense:  5 tablet    Refill:  0  . chlorpheniramine-HYDROcodone (TUSSIONEX PENNKINETIC ER) 10-8 MG/5ML SUER    Sig: Take 5 mLs by mouth every 12 (twelve) hours as needed for cough.    Dispense:  100 mL    Refill:  0  . famotidine (PEPCID) 40 MG tablet    Sig: Take 1 tablet (40 mg total) by mouth 2 (two) times daily for 20 days.    Dispense:  40 tablet    Refill:  0     -The patient was given clear instructions to go to ER or return to medical center if symptoms do not improve, worsen or new problems develop. The patient verbalized understanding.     Molli Barrows, FNP-C [facility], PRN Provider  [facility]. [place], [place] Clinic Phone: [phone] Clinic Fax: [phone] Clinic Hours: 5:30 pm -7:30 pm (Monday-Friday)

## 2019-07-14 NOTE — Patient Instructions (Signed)
I have placed a referral for you to follow-up with pulmonology. Complete all medications as prescribed. If your cough resolves, you may cancel pulmonology referral, at this point, it warrants further evaluation.    Cough, Adult A cough helps to clear your throat and lungs. A cough may be a sign of an illness or another medical condition. An acute cough may only last 2-3 weeks, while a chronic cough may last 8 or more weeks. Many things can cause a cough. They include:  Germs (viruses or bacteria) that attack the airway.  Breathing in things that bother (irritate) your lungs.  Allergies.  Asthma.  Mucus that runs down the back of your throat (postnasal drip).  Smoking.  Acid backing up from the stomach into the tube that moves food from the mouth to the stomach (gastroesophageal reflux).  Some medicines.  Lung problems.  Other medical conditions, such as heart failure or a blood clot in the lung (pulmonary embolism). Follow these instructions at home: Medicines  Take over-the-counter and prescription medicines only as told by your doctor.  Talk with your doctor before you take medicines that stop a cough (coughsuppressants). Lifestyle   Do not smoke, and try not to be around smoke. Do not use any products that contain nicotine or tobacco, such as cigarettes, e-cigarettes, and chewing tobacco. If you need help quitting, ask your doctor.  Drink enough fluid to keep your pee (urine) pale yellow.  Avoid caffeine.  Do not drink alcohol if your doctor tells you not to drink. General instructions   Watch for any changes in your cough. Tell your doctor about them.  Always cover your mouth when you cough.  Stay away from things that make you cough, such as perfume, candles, campfire smoke, or cleaning products.  If the air is dry, use a cool mist vaporizer or humidifier in your home.  If your cough is worse at night, try using extra pillows to raise your head up higher  while you sleep.  Rest as needed.  Keep all follow-up visits as told by your doctor. This is important. Contact a doctor if:  You have new symptoms.  You cough up pus.  Your cough does not get better after 2-3 weeks, or your cough gets worse.  Cough medicine does not help your cough and you are not sleeping well.  You have pain that gets worse or pain that is not helped with medicine.  You have a fever.  You are losing weight and you do not know why.  You have night sweats. Get help right away if:  You cough up blood.  You have trouble breathing.  Your heartbeat is very fast. These symptoms may be an emergency. Do not wait to see if the symptoms will go away. Get medical help right away. Call your local emergency services (911 in the [place] Do not drive yourself to the hospital. Summary  A cough helps to clear your throat and lungs. Many things can cause a cough.  Take over-the-counter and prescription medicines only as told by your doctor.  Always cover your mouth when you cough.  Contact a doctor if you have new symptoms or you have a cough that does not get better or gets worse. This information is not intended to replace advice given to you by your health care provider. Make sure you discuss any questions you have with your health care provider. Document Revised: 06/17/2018 Document Reviewed: 06/17/2018 Elsevier Patient Education  [place].

## 2019-07-14 NOTE — Progress Notes (Deleted)
Patient ID: Doc Mandala, '@GENDER'$ @     DOB: 01/20/1951 .   MRN: [id number]   PCP: [name], MD   No chief complaint on file.    Subjective:  HPI '@NAMEBYAGE'$ @ presents to [facility]  for evaluation of the following symptoms ****** related to a POSITIVE COVID-19 infection on **/**/21. Referred to clinic by PCP following a telemedicine (date*) in which provider referred patient for face- to- face evaluation due to concern for worsening symptoms. Abnormal chest x-ray since diagnosis of COVID-19? Y/N *** HELP TEXT *** Last Flowsheet data - This SmartLink displays the last value for a set of the specified flowsheet rows filed in any encounter. Time range: 100 days.  Usage - RISLASTFLOW[FlowsheetRecordIDs  This SmartLink has the following user-entered parameter: 1. FlowsheetRecordIDs - (Required) A comma-separated list of the flowsheet row/group IDs you want to see data for.  Examples: .RISLASTFLOW[5 outputs the latest entry for flowsheet row 5. .RISLASTFLOW[5,6 outputs the latest flowsheet entries for rows 5 and 6.   Steroid taper, injection, or burst?  Received antiviral infusion therapy with Remdesivir?  Y/N   Hepatic Function Latest Ref Rng & Units 02/10/2019 10/18/2017 04/20/2015  Total Protein 6.0 - 8.5 g/dL 6.7 7.0 7.0  Albumin 3.8 - 4.8 g/dL 4.5 4.5 4.2  AST 0 - 40 IU/L '22 30 27  '$ ALT 0 - 44 IU/L '11 17 15  '$ Alk Phosphatase 39 - 117 IU/L 85 80 59  Total Bilirubin 0.0 - 1.2 mg/dL 0.4 0.9 0.8  Bilirubin, Direct <=0.2 mg/dL - - 0.2     Covered with antimicrobial therapy: Other prescribed medication/OTC therapy:  Patient is high risk for complications related to COVID-19 infection due to the following comorbidities:   '@COVIDRISKCOMPLICATION'$ @  Allergies as of 07/14/2019   No Known Allergies     Medication List       Accurate as of July 14, 2019  5:34 PM. If you have any questions, ask your nurse or doctor.        albuterol 108 (90 Base) MCG/ACT  inhaler Commonly known as: VENTOLIN HFA Inhale 2 puffs into the lungs every 6 (six) hours as needed for wheezing or shortness of breath.   benzonatate 100 MG capsule Commonly known as: TESSALON Take 1 capsule (100 mg total) by mouth 3 (three) times daily as needed for cough.   cefdinir 300 MG capsule Commonly known as: OMNICEF Take 2 capsules (600 mg total) by mouth daily.   [facility] Cough DM 30 MG/5ML liquid Generic drug: dextromethorphan Take 5 mLs by mouth 4 (four) times daily as needed.   dextromethorphan 15 MG/5ML syrup Take 10 mLs (30 mg total) by mouth 4 (four) times daily as needed for cough.   Myrbetriq 25 MG Tb24 tablet Generic drug: mirabegron ER Take 25 mg by mouth daily.   predniSONE 20 MG tablet Commonly known as: DELTASONE Take 2 tablets (40 mg total) by mouth daily with breakfast.   sildenafil 100 MG tablet Commonly known as: VIAGRA TAKE 1/2 TO 1 TABLET BY MOUTH DAILY AS NEEDED FOR ERECTILE DYSFUNCTION           Objective:   Today's Vitals   06/25/19 1759  BP: (!) 160/90  Pulse: 74  Resp: 14  Temp: 97.8 F (36.6 C)  SpO2: 96%  Weight: 172 lb (78 kg)  Height: '5\' 5"'$  (1.651 m)    Wt Readings from Last 3 Encounters:  06/25/19 172 lb (78 kg)  04/25/19 177 lb 11.2 oz (80.6 kg)  11/13/18  177 lb 4.8 oz (80.4 kg)       No results found for: HGBA1C          Assessment & Plan:  1. Bronchitis due to COVID-19 virus *** - DG Chest 2 View; Future  2. Cough ***  3. Shortness of breath *** - albuterol (VENTOLIN HFA) 108 (90 Base) MCG/ACT inhaler 1-2 puff      -The patient was given clear instructions to go to ER or return to medical center if symptoms do not improve, worsen or new problems develop. The patient verbalized understanding.     Molli Barrows, FNP-C [facility], PRN Provider  [facility] [place], [place] Clinic Phone: [phone] Clinic Fax: [phone] Clinic Hours: 5:30 pm -7:30 pm (Monday-Friday)

## 2019-08-02 ENCOUNTER — Ambulatory Visit: Payer: Commercial Managed Care - PPO | Attending: Internal Medicine

## 2019-08-02 DIAGNOSIS — Z23 Encounter for immunization: Secondary | ICD-10-CM | POA: Insufficient documentation

## 2019-08-02 NOTE — Progress Notes (Signed)
   Covid-19 Vaccination Clinic  Name:  Jacob Logan    MRN: [id number] DOB: May 08, 1951  08/02/2019  [name] was observed post Covid-19 immunization for 15 minutes without incidence. He was provided with Vaccine Information Sheet and instruction to access the V-Safe system.   [name] was instructed to call 911 with any severe reactions post vaccine: [place] Kitchen Difficulty breathing  . Swelling of your face and throat  . A fast heartbeat  . A bad rash all over your body  . Dizziness and weakness    Immunizations Administered    Name Date Dose VIS Date Route   Pfizer COVID-19 Vaccine 08/02/2019  1:50 PM 0.3 mL 05/23/2019 Intramuscular   Manufacturer: [place]   Lot: [id number]   [place]: [id number]

## 2019-08-25 ENCOUNTER — Ambulatory Visit: Payer: Commercial Managed Care - PPO | Attending: Internal Medicine

## 2019-08-25 DIAGNOSIS — Z23 Encounter for immunization: Secondary | ICD-10-CM

## 2019-08-25 NOTE — Progress Notes (Signed)
   Covid-19 Vaccination Clinic  Name:  Jacob Logan    MRN: [id number] DOB: 1950-06-29  08/25/2019  Jacob Logan was observed post Covid-19 immunization for 15 minutes without incident. He was provided with Vaccine Information Sheet and instruction to access the V-Safe system.   Jacob Logan was instructed to call 911 with any severe reactions post vaccine: [place] Kitchen Difficulty breathing  . Swelling of face and throat  . A fast heartbeat  . A bad rash all over body  . Dizziness and weakness   Immunizations Administered    Name Date Dose VIS Date Route   Pfizer COVID-19 Vaccine 08/25/2019 10:18 AM 0.3 mL 05/23/2019 Intramuscular   Manufacturer: [place]   Lot: [id number]   [place]: [id number]

## 2019-10-14 ENCOUNTER — Other Ambulatory Visit: Payer: Self-pay | Admitting: Family Medicine

## 2019-10-14 DIAGNOSIS — N529 Male erectile dysfunction, unspecified: Secondary | ICD-10-CM

## 2020-03-05 ENCOUNTER — Telehealth: Payer: Self-pay | Admitting: Family Medicine

## 2020-03-05 NOTE — Telephone Encounter (Signed)
What is the name of the medication? sildenafil (VIAGRA) 100 MG tablet [[id number]]    Have you contacted your pharmacy to request a refill? Yes, the pharmacy is saying he has no refills left. He states he has only used one bottle and should have 2 refills left. Please advise   Which pharmacy would you like this sent to? [facility] at [address].    Patient notified that their request is being sent to the clinical staff for review and that they should receive a call once it is complete. If they do not receive a call within 72 hours they can check with their pharmacy or our office.

## 2020-03-05 NOTE — Telephone Encounter (Signed)
Pt needs an appt for med refill.

## 2020-03-08 NOTE — Telephone Encounter (Signed)
Called pt and sch appt with NP Maximiano Coss for tomorrow 03/09/20 for this med refill

## 2020-03-09 ENCOUNTER — Ambulatory Visit: Payer: Commercial Managed Care - PPO | Admitting: Registered Nurse

## 2020-03-09 ENCOUNTER — Other Ambulatory Visit: Payer: Self-pay

## 2020-03-09 ENCOUNTER — Encounter: Payer: Self-pay | Admitting: Registered Nurse

## 2020-03-09 VITALS — BP 115/74 | HR 64 | Temp 97.7°F | Resp 18 | Ht 69.0 in | Wt 137.2 lb

## 2020-03-09 DIAGNOSIS — Z23 Encounter for immunization: Secondary | ICD-10-CM | POA: Diagnosis not present

## 2020-03-09 DIAGNOSIS — N529 Male erectile dysfunction, unspecified: Secondary | ICD-10-CM | POA: Diagnosis not present

## 2020-03-09 MED ORDER — SILDENAFIL CITRATE 100 MG PO TABS: ORAL_TABLET | ORAL | 3 refills | Status: AC

## 2020-03-09 NOTE — Patient Instructions (Signed)
° ° ° °  If you have lab work done today you will be contacted with your lab results within the next 2 weeks.  If you have not heard from us then please contact us. The fastest way to get your results is to register for My Chart. ° ° °IF you received an x-ray today, you will receive an invoice from [facility]. Please contact  Radiology at [phone] with questions or concerns regarding your invoice.  ° °IF you received labwork today, you will receive an invoice from [facility]. Please contact [facility] at [phone] with questions or concerns regarding your invoice.  ° °Our billing staff will not be able to assist you with questions regarding bills from these companies. ° °You will be contacted with the lab results as soon as they are available. The fastest way to get your results is to activate your My Chart account. Instructions are located on the last page of this paperwork. If you have not heard from us regarding the results in 2 weeks, please contact this office. °  ° ° ° °

## 2020-03-09 NOTE — Progress Notes (Signed)
Established Patient Office Visit  Subjective:  Patient ID: Jacob Logan, male    DOB: [birth year]  Age: 69 y.o. MRN: [id number]  CC:  Chief Complaint  Patient presents with  . Medication Refill    Patient states he is here for medication refill for Viagra. Per patient he has no other questions or concerns     HPI Jacob Logan presents for med refill  Sildenafil - using 50mg  PO qd PRN. Usually once every 1-2 weeks. Good effect. Denies AEs including flushing, chest pain, tachycardia, headaches, visual changes, and others.   Not using any other medications  Cough from this spring has entirely resolved.  Past Medical History:  Diagnosis Date  . Anemia   . Bilateral plantar fasciitis   . Hemorrhoids   . History of hydrocele   . History of kidney stones   . Malnourished ([place])   . 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 Wears glasses     Past Surgical History:  Procedure Laterality Date  . COLONOSCOPY    . HYDROCELE EXCISION    . KIDNEY STONE SURGERY    . TONSILLECTOMY    . TRANSANAL HEMORRHOIDAL DEARTERIALIZATION N/A 05/02/2018   Procedure: TRANSANAL HEMORRHOIDAL DEARTERIALIZATION;  Surgeon: Leighton Ruff, MD;  Location: [facility];  Service: General;  Laterality: N/A;    Family History  Problem Relation Age of Onset  . Hypertension Mother   . Uterine cancer Mother   . Obesity Mother   . Hyperlipidemia Mother   . Heart disease Mother   . Diabetes Mother   . Cancer Mother   . Obesity Father   . Hypertension Father   . Diabetes Father   . Heart disease Father   . Hyperlipidemia Father   . Heart attack Father        First one in his 22s  . Stroke Brother   . Obesity Brother   . Alzheimer's disease Brother   . Diabetes Brother   . Heart disease Brother   . Hyperlipidemia Brother   . Hypertension Brother   . Cataracts Maternal Grandmother     Social History   Socioeconomic History  . Marital status: Single    Spouse name: Not on  file  . Number of children: Not on file  . Years of education: Not on file  . Highest education level: Not on file  Occupational History  . Not on file  Tobacco Use  . Smoking status: Never Smoker  . Smokeless tobacco: Never Used  Vaping Use  . Vaping Use: Never used  Substance and Sexual Activity  . Alcohol use: Yes    Alcohol/week: 1.0 standard drink    Types: 1 Standard drinks or equivalent per week  . Drug use: No  . Sexual activity: Never  Other Topics Concern  . Not on file  Social History Narrative  . Not on file   Social Determinants of Health   Financial Resource Strain:   . Difficulty of Paying Living Expenses: Not on file  Food Insecurity:   . Worried About Charity fundraiser in the Last Year: Not on file  . Ran Out of Food in the Last Year: Not on file  Transportation Needs:   . Lack of Transportation (Medical): Not on file  . Lack of Transportation (Non-Medical): Not on file  Physical Activity:   . Days of Exercise per Week: Not on file  . Minutes of Exercise per Session: Not on file  Stress:   .  Feeling of Stress : Not on file  Social Connections:   . Frequency of Communication with Friends and Family: Not on file  . Frequency of Social Gatherings with Friends and Family: Not on file  . Attends Religious Services: Not on file  . Active Member of Clubs or Organizations: Not on file  . Attends Archivist Meetings: Not on file  . Marital Status: Not on file  Intimate Partner Violence:   . Fear of Current or Ex-Partner: Not on file  . Emotionally Abused: Not on file  . Physically Abused: Not on file  . Sexually Abused: Not on file    Outpatient Medications Prior to Visit  Medication Sig Dispense Refill  . albuterol (VENTOLIN HFA) 108 (90 Base) MCG/ACT inhaler Inhale 2 puffs into the lungs every 6 (six) hours as needed for wheezing or shortness of breath. 18 g 2  . sildenafil (VIAGRA) 100 MG tablet TAKE 1/2 TO 1 TABLET BY MOUTH DAILY AS NEEDED  FOR ERECTILE DYSFUNCTION 5 tablet 3  . cefdinir (OMNICEF) 300 MG capsule Take 2 capsules (600 mg total) by mouth daily. (Patient not taking: Reported on 03/09/2020) 20 capsule 0  . chlorpheniramine-HYDROcodone (TUSSIONEX PENNKINETIC ER) 10-8 MG/5ML SUER Take 5 mLs by mouth every 12 (twelve) hours as needed for cough. (Patient not taking: Reported on 03/09/2020) 100 mL 0  . famotidine (PEPCID) 40 MG tablet Take 1 tablet (40 mg total) by mouth 2 (two) times daily for 20 days. 40 tablet 0  . mirabegron ER (MYRBETRIQ) 25 MG TB24 tablet Take 25 mg by mouth daily. (Patient not taking: Reported on 03/09/2020)     No facility-administered medications prior to visit.    No Known Allergies  ROS Review of Systems  Constitutional: Negative.   HENT: Negative.   Eyes: Negative.   Respiratory: Negative.   Cardiovascular: Negative.   Gastrointestinal: Negative.   Genitourinary: Negative.   Musculoskeletal: Negative.   Skin: Negative.   Neurological: Negative.   Psychiatric/Behavioral: Negative.       Objective:    Physical Exam Constitutional:      General: He is not in acute distress.    Appearance: Normal appearance. He is normal weight. He is not ill-appearing, toxic-appearing or diaphoretic.  Cardiovascular:     Rate and Rhythm: Normal rate and regular rhythm.     Heart sounds: Normal heart sounds. No murmur heard.  No friction rub. No gallop.   Pulmonary:     Effort: Pulmonary effort is normal. No respiratory distress.     Breath sounds: Normal breath sounds. No stridor. No wheezing, rhonchi or rales.  Chest:     Chest wall: No tenderness.  Neurological:     General: No focal deficit present.     Mental Status: He is alert and oriented to person, place, and time. Mental status is at baseline.  Psychiatric:        Mood and Affect: Mood normal.        Behavior: Behavior normal.        Thought Content: Thought content normal.        Judgment: Judgment normal.     BP 115/74    Pulse 64   Temp 97.7 F (36.5 C) (Temporal)   Resp 18   Ht 5\' 9"  (1.753 m)   Wt 137 lb 3.2 oz (62.2 kg)   SpO2 95%   BMI 20.26 kg/m  Wt Readings from Last 3 Encounters:  03/09/20 137 lb 3.2 oz (62.2 kg)  07/14/19  134 lb (60.8 kg)  07/02/19 137 lb (62.1 kg)     There are no preventive care reminders to display for this patient.  There are no preventive care reminders to display for this patient.  Lab Results  Component Value Date   TSH 2.320 10/18/2017   Lab Results  Component Value Date   WBC 5.2 02/10/2019   HGB 14.5 02/10/2019   HCT 43.3 02/10/2019   MCV 90 02/10/2019   PLT 190 02/10/2019   Lab Results  Component Value Date   NA 144 02/10/2019   K 4.6 02/10/2019   CO2 28 02/10/2019   GLUCOSE 97 02/10/2019   BUN 20 02/10/2019   CREATININE 1.15 02/10/2019   BILITOT 0.4 02/10/2019   ALKPHOS 85 02/10/2019   AST 22 02/10/2019   ALT 11 02/10/2019   PROT 6.7 02/10/2019   ALBUMIN 4.5 02/10/2019   CALCIUM 9.4 02/10/2019   ANIONGAP 7 11/22/2014   Lab Results  Component Value Date   CHOL 178 10/18/2017   Lab Results  Component Value Date   HDL 69 10/18/2017   Lab Results  Component Value Date   LDLCALC 99 10/18/2017   Lab Results  Component Value Date   TRIG 51 10/18/2017   Lab Results  Component Value Date   CHOLHDL 2.6 10/18/2017   No results found for: HGBA1C    Assessment & Plan:   Problem List Items Addressed This Visit    None    Visit Diagnoses    Flu vaccine need    -  Primary   Relevant Orders   Flu Vaccine QUAD High Dose(Fluad) (Completed)   Erectile dysfunction, unspecified erectile dysfunction type       Relevant Medications   sildenafil (VIAGRA) 100 MG tablet      Meds ordered this encounter  Medications  . sildenafil (VIAGRA) 100 MG tablet    Sig: Take 1/2 to 1 tablet po qd PRN.    Dispense:  10 tablet    Refill:  3    Follow-up: No follow-ups on file.   PLAN  Refill given, 10 tabs with 3 refills  Continue to  follow with Dr. Carlota Raspberry as scheduled  Return prn  Patient encouraged to call clinic with any questions, comments, or concerns.  Maximiano Coss, NP

## 2020-04-21 ENCOUNTER — Encounter: Payer: Self-pay | Admitting: Dermatology

## 2020-04-21 ENCOUNTER — Other Ambulatory Visit: Payer: Self-pay

## 2020-04-21 ENCOUNTER — Ambulatory Visit: Payer: Commercial Managed Care - PPO | Admitting: Dermatology

## 2020-04-21 DIAGNOSIS — Z1283 Encounter for screening for malignant neoplasm of skin: Secondary | ICD-10-CM

## 2020-04-21 DIAGNOSIS — D485 Neoplasm of uncertain behavior of skin: Secondary | ICD-10-CM | POA: Diagnosis not present

## 2020-04-21 DIAGNOSIS — L570 Actinic keratosis: Secondary | ICD-10-CM | POA: Diagnosis not present

## 2020-04-21 NOTE — Patient Instructions (Signed)

## 2020-05-05 ENCOUNTER — Encounter: Payer: Self-pay | Admitting: Dermatology

## 2020-05-05 NOTE — Progress Notes (Addendum)
   Follow-Up Visit   Subjective  Jacob Logan is a 69 y.o. male who presents for the following: Skin Problem (Patient here today for stop on left forehead x years. Per patient it's larger, no bleeding).  Crusts Location: Scalp and forehead Duration:  Quality:  Associated Signs/Symptoms: Modifying Factors:  Severity:  Timing: Context:   Objective  Well appearing patient in no apparent distress; mood and affect are within normal limits.  All skin waist up examined.   Assessment & Plan    Neoplasm of uncertain behavior of skin Left Frontal Scalp  Skin / nail biopsy Type of biopsy: tangential   Informed consent: discussed and consent obtained   Timeout: patient name, date of birth, surgical site, and procedure verified   Anesthesia: the lesion was anesthetized in a standard fashion   Anesthetic:  1% lidocaine w/ epinephrine 1-100,000 local infiltration Instrument used: flexible razor blade   Hemostasis achieved with: ferric subsulfate   Outcome: patient tolerated procedure well   Post-procedure details: wound care instructions given    Specimen 1 - Surgical pathology Differential Diagnosis: bcc scc Check Margins: No  AK (actinic keratosis) (4) Mid Frontal Scalp  Destruction of lesion - Mid Frontal Scalp Complexity: simple   Destruction method: cryotherapy   Informed consent: discussed and consent obtained   Lesion destroyed using liquid nitrogen: Yes   Cryotherapy cycles:  5 Outcome: patient tolerated procedure well with no complications    Skin exam for malignant neoplasm Mid Back  Annual skin examination     I, Jacob Monarch, MD, have reviewed all documentation for this visit.  The documentation on 05/16/20 for the exam, diagnosis, procedures, and orders are all accurate and complete.

## 2020-09-14 ENCOUNTER — Other Ambulatory Visit: Payer: Self-pay

## 2020-09-14 ENCOUNTER — Encounter (HOSPITAL_COMMUNITY): Payer: Self-pay

## 2020-09-14 ENCOUNTER — Ambulatory Visit (HOSPITAL_COMMUNITY)
Admission: RE | Admit: 2020-09-14 | Discharge: 2020-09-14 | Disposition: A | Discharge status: Home / Self Care | Payer: Commercial Managed Care - PPO | Source: Ambulatory Visit | Attending: Family Medicine | Admitting: Family Medicine

## 2020-09-14 VITALS — BP 128/73 | HR 61 | Temp 98.2°F | Resp 17

## 2020-09-14 DIAGNOSIS — M542 Cervicalgia: Secondary | ICD-10-CM

## 2020-09-14 NOTE — Discharge Instructions (Addendum)
Take Tylenol or ibuprofen as needed for discomfort.  Follow-up with your primary care provider or the neurosurgeon listed below.

## 2020-09-14 NOTE — ED Triage Notes (Signed)
Pt presents with left side neck pain X 7 months with no known injury.

## 2020-09-14 NOTE — ED Provider Notes (Signed)
[place]    CSN: [id number] Arrival date & time: 09/14/20  7412      History   Chief Complaint Chief Complaint  Patient presents with  . APPOINTMENT: Neck Pain    HPI Jacob Logan is a 70 y.o. male.   Patient presents with neck pain for the past 7 to 9 months.  The pain is primarily on the left side of his neck.  No falls or injury.  He does not currently have pain.  He states it occurs when he steps off a curb or has an acute movement.  The pain lasts approximately 1 second and resolve spontaneously.  He states he can hear a "grinding" sound in his neck when he turns it.  He denies numbness, weakness, paresthesias, or other symptoms.  His medical history includes scoliosis, osteomyelitis of cervical spine in 2016, malnutrition, renal stone, CKD.  The history is provided by the patient and medical records.    Past Medical History:  Diagnosis Date  . Anemia   . Bilateral plantar fasciitis   . Hemorrhoids   . History of hydrocele   . History of kidney stones   . Malnourished ([place])   . 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 Wears glasses     Patient Active Problem List   Diagnosis Date Noted  . CKD (chronic kidney disease), stage III ([place]) 11/22/2014  . Renal stone 11/19/2014  . Malnutrition of moderate degree ([place]) 11/19/2014  . Scoliosis (and kyphoscoliosis), idiopathic 10/18/2014    Past Surgical History:  Procedure Laterality Date  . COLONOSCOPY    . HYDROCELE EXCISION    . KIDNEY STONE SURGERY    . TONSILLECTOMY    . TRANSANAL HEMORRHOIDAL DEARTERIALIZATION N/A 05/02/2018   Procedure: TRANSANAL HEMORRHOIDAL DEARTERIALIZATION;  Surgeon: Leighton Ruff, MD;  Location: [facility];  Service: General;  Laterality: N/A;       Home Medications    Prior to Admission medications   Medication Sig Start Date End Date Taking? Authorizing Provider  albuterol (VENTOLIN HFA) 108 (90 Base) MCG/ACT inhaler Inhale 2 puffs into the lungs  every 6 (six) hours as needed for wheezing or shortness of breath. 06/16/19   Maximiano Coss, NP  cefdinir (OMNICEF) 300 MG capsule Take 2 capsules (600 mg total) by mouth daily. 07/04/19   Scot Jun, FNP  chlorpheniramine-HYDROcodone (TUSSIONEX PENNKINETIC ER) 10-8 MG/5ML SUER Take 5 mLs by mouth every 12 (twelve) hours as needed for cough. 07/14/19   Scot Jun, FNP  famotidine (PEPCID) 40 MG tablet Take 1 tablet (40 mg total) by mouth 2 (two) times daily for 20 days. 07/14/19 08/03/19  Scot Jun, FNP  mirabegron ER (MYRBETRIQ) 25 MG TB24 tablet Take 25 mg by mouth daily.     [provider]  sildenafil (VIAGRA) 100 MG tablet Take 1/2 to 1 tablet po qd PRN. 03/09/20   Maximiano Coss, NP  silodosin (RAPAFLO) 8 MG CAPS capsule Take 8 mg by mouth daily. 10/29/19   [provider]    Family History Family History  Problem Relation Age of Onset  . Hypertension Mother   . Uterine cancer Mother   . Obesity Mother   . Hyperlipidemia Mother   . Heart disease Mother   . Diabetes Mother   . Cancer Mother   . Obesity Father   . Hypertension Father   . Diabetes Father   . Heart disease Father   . Hyperlipidemia Father   . Heart attack  Father        First one in his 69s  . Stroke Brother   . Obesity Brother   . Alzheimer's disease Brother   . Diabetes Brother   . Heart disease Brother   . Hyperlipidemia Brother   . Hypertension Brother   . Cataracts Maternal Grandmother     Social History Social History   Tobacco Use  . Smoking status: Never Smoker  . Smokeless tobacco: Never Used  Vaping Use  . Vaping Use: Never used  Substance Use Topics  . Alcohol use: Yes    Alcohol/week: 1.0 standard drink    Types: 1 Standard drinks or equivalent per week  . Drug use: No     Allergies   Patient has no known allergies.   Review of Systems Review of Systems  Constitutional: Negative for chills and fever.  HENT: Negative for ear pain and sore  throat.   Eyes: Negative for pain and visual disturbance.  Respiratory: Negative for cough and shortness of breath.   Cardiovascular: Negative for chest pain and palpitations.  Gastrointestinal: Negative for abdominal pain and vomiting.  Genitourinary: Negative for dysuria and hematuria.  Musculoskeletal: Positive for neck pain. Negative for arthralgias, back pain and gait problem.  Skin: Negative for color change and rash.  Neurological: Negative for syncope, weakness and numbness.  All other systems reviewed and are negative.    Physical Exam Triage Vital Signs ED Triage Vitals  Enc Vitals Group     BP      Pulse      Resp      Temp      Temp src      SpO2      Weight      Height      Head Circumference      Peak Flow      Pain Score      Pain Loc      Pain Edu?      Excl. in [place]?    No data found.  Updated Vital Signs BP 128/73 (BP Location: Right Arm)   Pulse 61   Temp 98.2 F (36.8 C) (Oral)   Resp 17   SpO2 97%   Visual Acuity Right Eye Distance:   Left Eye Distance:   Bilateral Distance:    Right Eye Near:   Left Eye Near:    Bilateral Near:     Physical Exam Vitals and nursing note reviewed.  Constitutional:      General: He is not in acute distress.    Appearance: He is well-developed.  HENT:     Head: Normocephalic and atraumatic.     Mouth/Throat:     Mouth: Mucous membranes are moist.  Eyes:     Conjunctiva/sclera: Conjunctivae normal.  Cardiovascular:     Rate and Rhythm: Normal rate and regular rhythm.     Heart sounds: Normal heart sounds.  Pulmonary:     Effort: Pulmonary effort is normal. No respiratory distress.     Breath sounds: Normal breath sounds.  Abdominal:     Palpations: Abdomen is soft.     Tenderness: There is no abdominal tenderness.  Musculoskeletal:        General: No swelling or tenderness. Normal range of motion.     Cervical back: Neck supple.  Skin:    General: Skin is warm and dry.     Capillary Refill:  Capillary refill takes less than 2 seconds.     Findings: No bruising,  erythema, lesion or rash.  Neurological:     General: No focal deficit present.     Mental Status: He is alert and oriented to person, place, and time.     Sensory: No sensory deficit.     Motor: No weakness.     Gait: Gait normal.  Psychiatric:        Mood and Affect: Mood normal.        Behavior: Behavior normal.      UC Treatments / Results  Labs (all labs ordered are listed, but only abnormal results are displayed) Labs Reviewed - No data to display  EKG   Radiology No results found.  Procedures Procedures (including critical care time)  Medications Ordered in UC Medications - No data to display  Initial Impression / Assessment and Plan / UC Course  I have reviewed the triage vital signs and the nursing notes.  Pertinent labs & imaging results that were available during my care of the patient were reviewed by me and considered in my medical decision making (see chart for details).   Neck pain.  Patient is currently asymptomatic.  No indication of trauma or infection.  He is well-appearing and his exam is reassuring.  He has not seen his PCP for his symptoms; he contacted his PCPs office and was instructed to come here.  Instructed patient to take Tylenol or ibuprofen as needed for discomfort.  Discussed that he should follow-up with [facility] for additional evaluation of his neck pain.  He agrees to plan of care.   Final Clinical Impressions(s) / UC Diagnoses   Final diagnoses:  Neck pain     Discharge Instructions     Take Tylenol or ibuprofen as needed for discomfort.  Follow-up with your primary care provider or the neurosurgeon listed below.        ED Prescriptions    None     I have reviewed the PDMP during this encounter.   Sharion Balloon, NP 09/14/20 1017

## 2021-04-06 ENCOUNTER — Telehealth: Payer: Commercial Managed Care - PPO | Admitting: Family Medicine
